# Patient Record
Sex: Male | Born: 1975 | Hispanic: Yes | Marital: Single | Smoking: Never smoker
Health system: Southern US, Community
[De-identification: ages and names within clinical notes are randomized; demographics above are authoritative.]

---

## 2017-06-20 ENCOUNTER — Ambulatory Visit: Payer: Self-pay

## 2018-06-18 ENCOUNTER — Other Ambulatory Visit: Payer: Self-pay

## 2018-06-18 ENCOUNTER — Emergency Department
Admission: EM | Admit: 2018-06-18 | Discharge: 2018-06-18 | Disposition: A | Discharge status: Home / Self Care | Payer: BC Managed Care – PPO | Attending: Emergency Medicine | Admitting: Emergency Medicine

## 2018-06-18 DIAGNOSIS — R1010 Upper abdominal pain, unspecified: Secondary | ICD-10-CM

## 2018-06-18 DIAGNOSIS — K2970 Gastritis, unspecified, without bleeding: Secondary | ICD-10-CM | POA: Diagnosis not present

## 2018-06-18 DIAGNOSIS — R109 Unspecified abdominal pain: Secondary | ICD-10-CM | POA: Diagnosis present

## 2018-06-18 LAB — COMPREHENSIVE METABOLIC PANEL
ALT: 45 U/L — ABNORMAL HIGH (ref 0–44)
AST: 36 U/L (ref 15–41)
Albumin: 4.3 g/dL (ref 3.5–5.0)
Alkaline Phosphatase: 49 U/L (ref 38–126)
Anion gap: 9 (ref 5–15)
BUN: 24 mg/dL — ABNORMAL HIGH (ref 6–20)
CO2: 23 mmol/L (ref 22–32)
Calcium: 8.9 mg/dL (ref 8.9–10.3)
Chloride: 104 mmol/L (ref 98–111)
Creatinine, Ser: 0.87 mg/dL (ref 0.61–1.24)
GFR calc Af Amer: >60 mL/min (ref >60)
GFR calc non Af Amer: >60 mL/min (ref >60)
Glucose, Bld: 148 mg/dL — ABNORMAL HIGH (ref 70–99)
Potassium: 3.8 mmol/L (ref 3.5–5.1)
Sodium: 136 mmol/L (ref 135–145)
Total Bilirubin: 1 mg/dL (ref 0.3–1.2)
Total Protein: 7.6 g/dL (ref 6.5–8.1)

## 2018-06-18 LAB — CBC
HCT: 41.5 % (ref 39.0–52.0)
Hemoglobin: 14.8 g/dL (ref 13.0–17.0)
MCH: 30.6 pg (ref 26.0–34.0)
MCHC: 35.7 g/dL (ref 30.0–36.0)
MCV: 85.7 fL (ref 80.0–100.0)
Platelets: 204 10*3/uL (ref 150–400)
RBC: 4.84 MIL/uL (ref 4.22–5.81)
RDW: 12.9 % (ref 11.5–15.5)
WBC: 15 10*3/uL — ABNORMAL HIGH (ref 4.0–10.5)
nRBC: 0 % (ref 0.0–0.2)

## 2018-06-18 LAB — LIPASE, BLOOD: Lipase: 29 U/L (ref 11–51)

## 2018-06-18 MED ORDER — PANTOPRAZOLE SODIUM 20 MG PO TBEC: 20.0000 mg | DELAYED_RELEASE_TABLET | Freq: Every day | ORAL | 1 refills | Status: DC

## 2018-06-18 MED ORDER — SUCRALFATE 1 G PO TABS: 1.0000 g | ORAL_TABLET | Freq: Four times a day (QID) | ORAL | 0 refills | Status: DC

## 2018-06-18 MED ORDER — LIDOCAINE VISCOUS HCL 2 % MT SOLN
15.0000 mL | Freq: Once | OROMUCOSAL | Status: AC
  Administered 2018-06-18: 15 mL via ORAL
  Filled 2018-06-18: qty 15

## 2018-06-18 MED ORDER — ALUM & MAG HYDROXIDE-SIMETH 200-200-20 MG/5ML PO SUSP
30.0000 mL | Freq: Once | ORAL | Status: AC
  Administered 2018-06-18: 30 mL via ORAL
  Filled 2018-06-18: qty 30

## 2018-06-18 MED ORDER — LIDOCAINE VISCOUS HCL 2 % MT SOLN
15.0000 mL | Freq: Once | OROMUCOSAL | Status: AC
  Administered 2018-06-18: 17:00:00 15 mL via ORAL
  Filled 2018-06-18: qty 15

## 2018-06-18 MED ORDER — ONDANSETRON HCL 4 MG/2ML IJ SOLN
INTRAMUSCULAR | Status: AC
  Filled 2018-06-18: qty 2

## 2018-06-18 MED ORDER — ONDANSETRON HCL 4 MG/2ML IJ SOLN
4.0000 mg | Freq: Once | INTRAMUSCULAR | Status: AC
  Administered 2018-06-18: 4 mg via INTRAVENOUS

## 2018-06-18 NOTE — ED Notes (Signed)
Pt requesting to speak to MD at this time, states pain still 8/10. Would like to speak to MD prior to d/c

## 2018-06-18 NOTE — ED Triage Notes (Signed)
Generalized abdominal pain that started today, throbbing 10/10, NVD. Denies CP or SOB. Denies urinary sx.

## 2018-06-18 NOTE — Discharge Instructions (Signed)
Please seek medical attention for any high fevers, chest pain, shortness of breath, change in behavior, persistent vomiting, bloody stool or any other new or concerning symptoms.  

## 2018-06-18 NOTE — ED Notes (Addendum)
Patient states "my stomach feels very full and I cannot get comfortable. I feel lots of contractions in my stomach that wont stop. I cannot sit down or stand without pain"

## 2018-06-18 NOTE — ED Notes (Signed)
Patient given oral meds (See MAR). Patient vomitd up meds about 1 minute after taking. MD made aware and ordered IV zofran

## 2018-06-18 NOTE — ED Provider Notes (Addendum)
[facility] Emergency Department Provider Note   ____________________________________________   I have reviewed the triage vital signs and the nursing notes.   HISTORY  Chief Complaint Abdominal Pain   History limited by: Not Limited   HPI Matthew Li is a 43 y.o. male who presents to the emergency department today because of concern for abdominal pain. Started this morning and it has been constant. It is severe. Worse with lying flat. Has had some nausea. Did make himself throw up but that did not help his discomfort. Has had some diarrhea. Denies any fevers. Denies similar pain in the past.    History reviewed. No pertinent past medical history.  There are no active problems to display for this patient.   History reviewed. No pertinent surgical history.  Prior to Admission medications   Not on File    Allergies Patient has no known allergies.  No family history on file.  Social History Social History   Tobacco Use  . Smoking status: Never Smoker  Substance Use Topics  . Alcohol use: Yes  . Drug use: Not on file    Review of Systems Constitutional: No fever/chills Eyes: No visual changes. ENT: No sore throat. Cardiovascular: Denies chest pain. Respiratory: Denies shortness of breath. Gastrointestinal: Positive for abdominal pain.  Genitourinary: Negative for dysuria. Musculoskeletal: Negative for back pain. Skin: Negative for rash. Neurological: Negative for headaches, focal weakness or numbness.  ____________________________________________   PHYSICAL EXAM:  VITAL SIGNS: ED Triage Vitals  Enc Vitals Group     BP 06/18/18 1514 120/62     Pulse Rate 06/18/18 1514 65     Resp 06/18/18 1514 18     Temp 06/18/18 1514 98.4 F (36.9 C)     Temp Source 06/18/18 1514 Oral     SpO2 06/18/18 1514 99 %     Weight 06/18/18 1514 165 lb 4 oz (75 kg)     Height 06/18/18 1514 5\' 3"  (1.6 m)     Head Circumference --      Peak  Flow --      Pain Score 06/18/18 1526 10   Constitutional: Alert and oriented.  Eyes: Conjunctivae are normal.  ENT      Head: Normocephalic and atraumatic.      Nose: No congestion/rhinnorhea.      Mouth/Throat: Mucous membranes are moist.      Neck: No stridor. Hematological/Lymphatic/Immunilogical: No cervical lymphadenopathy. Cardiovascular: Normal rate, regular rhythm.  No murmurs, rubs, or gallops.  Respiratory: Normal respiratory effort without tachypnea nor retractions. Breath sounds are clear and equal bilaterally. No wheezes/rales/rhonchi. Gastrointestinal: Soft and tender to palpation in the upper abdomen. Genitourinary: Deferred Musculoskeletal: Normal range of motion in all extremities. No lower extremity edema. Neurologic:  Normal speech and language. No gross focal neurologic deficits are appreciated.  Skin:  Skin is warm, dry and intact. No rash noted. Psychiatric: Mood and affect are normal. Speech and behavior are normal. Patient exhibits appropriate insight and judgment.  ____________________________________________    LABS (pertinent positives/negatives)  Lipase 29 CBC wbc 15.0, hgb 14.8, plt 204 CMP wnl except glu 148, bun 24, alt 45  ____________________________________________   EKG  I, Phineas SemenGraydon [place] Dube, attending physician, personally viewed and interpreted this EKG  EKG Time: 1518 Rate: 59 Rhythm: sinus bradycardia Axis: normal Intervals: qtc 429 QRS: narrow ST changes: no st elevation Impression: bradycardia otherwise normal ekg   ____________________________________________    RADIOLOGY  None  ____________________________________________   PROCEDURES  Procedures  ____________________________________________  INITIAL IMPRESSION / ASSESSMENT AND PLAN / ED COURSE  Pertinent labs & imaging results that were available during my care of the patient were reviewed by me and considered in my medical decision making (see chart for  details).   Patient presented to the emergency department today because of concern for abdominal pain. On exam patient has some tenderness in the upper abdomen. No specific tenderness over the RUQ. Patient's blood work showed a mild leukocytosis. The patient did feel improvement after GI cocktail. At this point I think gastritis likely. Doubt gallbladder disease given no significant RUQ tenderness. Also doubt appendicitis given lack of lower abdominal tenderness or fever. Discussed possible gastritis with patient. Will give patient medication and information of dietary changes.  ____________________________________________   FINAL CLINICAL IMPRESSION(S) / ED DIAGNOSES  Final diagnoses:  Pain of upper abdomen  Gastritis without bleeding, unspecified chronicity, unspecified gastritis type     Note: This dictation was prepared with Dragon dictation. Any transcriptional errors that result from this process are unintentional     Phineas Semen, MD 06/18/18 1759    Phineas Semen, MD 06/25/18 [phone]

## 2018-06-19 ENCOUNTER — Emergency Department: Payer: BC Managed Care – PPO

## 2018-06-19 ENCOUNTER — Ambulatory Visit (INDEPENDENT_AMBULATORY_CARE_PROVIDER_SITE_OTHER): Payer: BC Managed Care – PPO | Admitting: Gastroenterology

## 2018-06-19 ENCOUNTER — Telehealth: Payer: Self-pay | Admitting: Gastroenterology

## 2018-06-19 ENCOUNTER — Other Ambulatory Visit: Payer: Self-pay

## 2018-06-19 ENCOUNTER — Inpatient Hospital Stay
Admission: EM | Admit: 2018-06-19 | Discharge: 2018-06-29 | DRG: 339 | Disposition: A | Discharge status: Home / Self Care | Payer: BC Managed Care – PPO | Attending: Surgery | Admitting: Surgery

## 2018-06-19 ENCOUNTER — Encounter: Payer: Self-pay | Admitting: Radiology

## 2018-06-19 DIAGNOSIS — K3580 Unspecified acute appendicitis: Secondary | ICD-10-CM

## 2018-06-19 DIAGNOSIS — R109 Unspecified abdominal pain: Secondary | ICD-10-CM | POA: Diagnosis not present

## 2018-06-19 DIAGNOSIS — R1084 Generalized abdominal pain: Secondary | ICD-10-CM | POA: Diagnosis not present

## 2018-06-19 DIAGNOSIS — Z5331 Laparoscopic surgical procedure converted to open procedure: Secondary | ICD-10-CM

## 2018-06-19 DIAGNOSIS — K651 Peritoneal abscess: Secondary | ICD-10-CM

## 2018-06-19 DIAGNOSIS — K3532 Acute appendicitis with perforation and localized peritonitis, without abscess: Principal | ICD-10-CM | POA: Diagnosis present

## 2018-06-19 DIAGNOSIS — Z79899 Other long term (current) drug therapy: Secondary | ICD-10-CM

## 2018-06-19 DIAGNOSIS — R339 Retention of urine, unspecified: Secondary | ICD-10-CM | POA: Diagnosis present

## 2018-06-19 DIAGNOSIS — K9189 Other postprocedural complications and disorders of digestive system: Secondary | ICD-10-CM

## 2018-06-19 DIAGNOSIS — K660 Peritoneal adhesions (postprocedural) (postinfection): Secondary | ICD-10-CM | POA: Diagnosis present

## 2018-06-19 DIAGNOSIS — Z4659 Encounter for fitting and adjustment of other gastrointestinal appliance and device: Secondary | ICD-10-CM

## 2018-06-19 DIAGNOSIS — K567 Ileus, unspecified: Secondary | ICD-10-CM | POA: Diagnosis present

## 2018-06-19 LAB — COMPREHENSIVE METABOLIC PANEL
ALT: 33 U/L (ref 0–44)
AST: 25 U/L (ref 15–41)
Albumin: 4.4 g/dL (ref 3.5–5.0)
Alkaline Phosphatase: 56 U/L (ref 38–126)
Anion gap: 13 (ref 5–15)
BUN: 16 mg/dL (ref 6–20)
CO2: 24 mmol/L (ref 22–32)
Calcium: 9.2 mg/dL (ref 8.9–10.3)
Chloride: 97 mmol/L — ABNORMAL LOW (ref 98–111)
Creatinine, Ser: 1.14 mg/dL (ref 0.61–1.24)
GFR calc Af Amer: >60 mL/min (ref >60)
GFR calc non Af Amer: >60 mL/min (ref >60)
Glucose, Bld: 165 mg/dL — ABNORMAL HIGH (ref 70–99)
Potassium: 4 mmol/L (ref 3.5–5.1)
Sodium: 134 mmol/L — ABNORMAL LOW (ref 135–145)
Total Bilirubin: 3.4 mg/dL — ABNORMAL HIGH (ref 0.3–1.2)
Total Protein: 8.6 g/dL — ABNORMAL HIGH (ref 6.5–8.1)

## 2018-06-19 LAB — URINALYSIS, COMPLETE (UACMP) WITH MICROSCOPIC
Bacteria, UA: NONE SEEN
Bilirubin Urine: NEGATIVE
Glucose, UA: NEGATIVE mg/dL
Ketones, ur: NEGATIVE mg/dL
Leukocytes,Ua: NEGATIVE
Nitrite: NEGATIVE
Protein, ur: 100 mg/dL — AB
Specific Gravity, Urine: 1.027 (ref 1.005–1.030)
pH: 5 (ref 5.0–8.0)

## 2018-06-19 LAB — CBC WITH DIFFERENTIAL/PLATELET
Abs Immature Granulocytes: 0.06 10*3/uL (ref 0.00–0.07)
Basophils Absolute: 0 10*3/uL (ref 0.0–0.1)
Basophils Relative: 0 %
Eosinophils Absolute: 0.2 10*3/uL (ref 0.0–0.5)
Eosinophils Relative: 1 %
HCT: 46.7 % (ref 39.0–52.0)
Hemoglobin: 16.5 g/dL (ref 13.0–17.0)
Immature Granulocytes: 0 %
Lymphocytes Relative: 6 %
Lymphs Abs: 0.9 10*3/uL (ref 0.7–4.0)
MCH: 30.2 pg (ref 26.0–34.0)
MCHC: 35.3 g/dL (ref 30.0–36.0)
MCV: 85.4 fL (ref 80.0–100.0)
Monocytes Absolute: 0.7 10*3/uL (ref 0.1–1.0)
Monocytes Relative: 5 %
Neutro Abs: 12.2 10*3/uL — ABNORMAL HIGH (ref 1.7–7.7)
Neutrophils Relative %: 88 %
Platelets: 222 10*3/uL (ref 150–400)
RBC: 5.47 MIL/uL (ref 4.22–5.81)
RDW: 13 % (ref 11.5–15.5)
WBC: 14.1 10*3/uL — ABNORMAL HIGH (ref 4.0–10.5)
nRBC: 0 % (ref 0.0–0.2)

## 2018-06-19 LAB — LIPASE, BLOOD: Lipase: 24 U/L (ref 11–51)

## 2018-06-19 MED ORDER — POLYETHYLENE GLYCOL 3350 17 G PO PACK: 17.0000 g | PACK | Freq: Every day | ORAL | 0 refills | Status: DC

## 2018-06-19 MED ORDER — PANTOPRAZOLE SODIUM 40 MG PO TBEC: 40.0000 mg | DELAYED_RELEASE_TABLET | Freq: Every day | ORAL | 0 refills | Status: DC

## 2018-06-19 MED ORDER — MORPHINE SULFATE (PF) 4 MG/ML IV SOLN
4.0000 mg | Freq: Once | INTRAVENOUS | Status: AC
  Administered 2018-06-19: 22:00:00 4 mg via INTRAVENOUS
  Filled 2018-06-19: qty 1

## 2018-06-19 MED ORDER — IOHEXOL 300 MG/ML  SOLN
100.0000 mL | Freq: Once | INTRAMUSCULAR | Status: AC | PRN
  Administered 2018-06-19: 100 mL via INTRAVENOUS

## 2018-06-19 MED ORDER — PIPERACILLIN-TAZOBACTAM 3.375 G IVPB 30 MIN
3.3750 g | Freq: Once | INTRAVENOUS | Status: AC
  Administered 2018-06-19: 23:00:00 3.375 g via INTRAVENOUS
  Filled 2018-06-19: qty 50

## 2018-06-19 MED ORDER — SODIUM CHLORIDE 0.9 % IV BOLUS
1000.0000 mL | Freq: Once | INTRAVENOUS | Status: AC
  Administered 2018-06-19: 22:00:00 1000 mL via INTRAVENOUS

## 2018-06-19 MED ORDER — ONDANSETRON HCL 4 MG/2ML IJ SOLN
4.0000 mg | Freq: Once | INTRAMUSCULAR | Status: AC
  Administered 2018-06-19: 22:00:00 4 mg via INTRAVENOUS
  Filled 2018-06-19: qty 2

## 2018-06-19 MED ORDER — HYDROMORPHONE HCL 1 MG/ML IJ SOLN
1.0000 mg | Freq: Once | INTRAMUSCULAR | Status: AC
  Administered 2018-06-19: 1 mg via INTRAVENOUS
  Filled 2018-06-19: qty 1

## 2018-06-19 MED ORDER — ACETAMINOPHEN 500 MG PO TABS
1000.0000 mg | ORAL_TABLET | Freq: Once | ORAL | Status: AC
  Administered 2018-06-19: 22:00:00 1000 mg via ORAL
  Filled 2018-06-19: qty 2

## 2018-06-19 NOTE — ED Provider Notes (Signed)
[facility] Emergency Department Provider Note  ____________________________________________  Time seen: Approximately 10:58 PM  I have reviewed the triage vital signs and the nursing notes.   HISTORY  Chief Complaint Abdominal Pain   HPI Matthew Li is a 43 y.o. male with no significant past medical history who presents for evaluation of abdominal pain.  Patient is complaining of initially diffuse now lower abdominal pain that is severe, sharp, constant and nonradiating.  Pain associated with nausea, one episode of vomiting, one episode of diarrhea yesterday, and now fever.  Patient was seen here yesterday and discharged home with possible gastroenteritis.  Patient reports that the pain got worse.  No prior abdominal surgeries.  Pain is currently 10 out of 10.  PMH None - reviewed  Prior to Admission medications   Medication Sig Start Date End Date Taking? Authorizing Provider  pantoprazole (PROTONIX) 40 MG tablet Take 1 tablet (40 mg total) by mouth daily. 06/19/18   Pasty Spillersahiliani, Varnita B, MD  polyethylene glycol (MIRALAX / GLYCOLAX) 17 g packet Take 17 g by mouth daily. 06/19/18   Pasty Spillersahiliani, Varnita B, MD  sucralfate (CARAFATE) 1 g tablet Take 1 tablet (1 g total) by mouth 4 (four) times daily. 06/18/18   Phineas SemenGoodman, Graydon, MD    Allergies Patient has no known allergies.  No family history on file.  Social History Social History   Tobacco Use  . Smoking status: Never Smoker  Substance Use Topics  . Alcohol use: Yes  . Drug use: Not on file    Review of Systems  Constitutional: Negative for fever. Eyes: Negative for visual changes. ENT: Negative for sore throat. Neck: No neck pain  Cardiovascular: Negative for chest pain. Respiratory: Negative for shortness of breath. Gastrointestinal: + abdominal pain, nausea, vomiting and diarrhea. Genitourinary: Negative for dysuria. Musculoskeletal: Negative for back pain. Skin: Negative for rash.  Neurological: Negative for headaches, weakness or numbness. Psych: No SI or HI  ____________________________________________   PHYSICAL EXAM:  VITAL SIGNS: ED Triage Vitals  Enc Vitals Group     BP 06/19/18 2152 124/83     Pulse Rate 06/19/18 2152 (!) 133     Resp 06/19/18 2152 20     Temp 06/19/18 2152 100.2 F (37.9 C)     Temp Source 06/19/18 2152 Oral     SpO2 06/19/18 2152 100 %     Weight 06/19/18 2151 165 lb 5.5 oz (75 kg)     Height --      Head Circumference --      Peak Flow --      Pain Score 06/19/18 2151 10     Pain Loc --      Pain Edu? --      Excl. in GC? --     Constitutional: Alert and oriented. Well appearing and in no apparent distress. HEENT:      Head: Normocephalic and atraumatic.         Eyes: Conjunctivae are normal. Sclera is non-icteric.       Mouth/Throat: Mucous membranes are moist.       Neck: Supple with no signs of meningismus. Cardiovascular: Tachycardic with regular rhythm. No murmurs, gallops, or rubs. 2+ symmetrical distal pulses are present in all extremities. No JVD. Respiratory: Normal respiratory effort. Lungs are clear to auscultation bilaterally. No wheezes, crackles, or rhonchi.  Gastrointestinal: Soft, diffuse tenderness to palpation with significant tenderness in the right lower quadrant and localized guarding, and non distended with positive bowel sounds.  Musculoskeletal: Nontender with normal range of motion in all extremities. No edema, cyanosis, or erythema of extremities. Neurologic: Normal speech and language. Face is symmetric. Moving all extremities. No gross focal neurologic deficits are appreciated. Skin: Skin is warm, dry and intact. No rash noted. Psychiatric: Mood and affect are normal. Speech and behavior are normal.  ____________________________________________   LABS (all labs ordered are listed, but only abnormal results are displayed)  Labs Reviewed  COMPREHENSIVE METABOLIC PANEL - Abnormal; Notable for  the following components:      Result Value   Sodium 134 (*)    Chloride 97 (*)    Glucose, Bld 165 (*)    Total Protein 8.6 (*)    Total Bilirubin 3.4 (*)    All other components within normal limits  LIPASE, BLOOD  CBC WITH DIFFERENTIAL/PLATELET  URINALYSIS, COMPLETE (UACMP) WITH MICROSCOPIC   ____________________________________________  EKG  none  ____________________________________________  RADIOLOGY  I have personally reviewed the images performed during this visit and I agree with the Radiologist's read.   Interpretation by Radiologist:  Ct Abdomen Pelvis W Contrast  Result Date: 06/19/2018 CLINICAL DATA:  43 year old male with right abdominal pain since yesterday. EXAM: CT ABDOMEN AND PELVIS WITH CONTRAST TECHNIQUE: Multidetector CT imaging of the abdomen and pelvis was performed using the standard protocol following bolus administration of intravenous contrast. CONTRAST:  OMNIPAQUE IOHEXOL 300 MG/ML  SOLN COMPARISON:  None. FINDINGS: Lower chest: Mild respiratory motion, otherwise negative. Hepatobiliary: Hepatic steatosis. Negative gallbladder. No bile duct enlargement. Pancreas: Negative. Spleen: Negative. Adrenals/Urinary Tract: Normal adrenal glands. Normal bilateral renal enhancement and contrast excretion. Retroperitoneal stranding along the course of the ureters at the pelvic inlet, more so the right. Diminutive and unremarkable urinary bladder. Stomach/Bowel: Decompressed rectosigmoid colon with regional fluid and inflammatory stranding secondary to the appendix. Similar decompressed distal transverse and descending colon. Gas and liquid stool in the proximal transverse and the right colon. Dilated and inflamed appendix courses into the midline and across into the left abdomen from the cecum. There is a 10 millimeter round appendicoliths at the origin (series 2 images 56 and 58 and coronal images 49 through 46). Confluent regional inflammatory stranding. No  extraluminal gas. Regional free fluid including tracking into the distal small bowel mesentery on series 2, image 52. Secondary inflammation of the terminal ileum and other regional small bowel loops. Regional reactive appearing lymphadenopathy in the mesentery. Appendix: Location: Tracks across midline to the left posterior to small bowel in the upper abdomen. Diameter: 15 millimeters Appendicolith: Positive, 10 millimeters Mucosal hyper-enhancement: Positive Extraluminal gas: Negative Periappendiceal collection: Phlegmon and free fluid, no organized collection. There are mildly dilated fluid-filled small bowel loops in the left abdomen. These have a gradual transition. Largely decompressed and negative stomach. No free air identified. Vascular/Lymphatic: Major arterial structures are patent and normal. Portal venous system is patent. No other abnormal lymph nodes Reproductive: Negative. Other: Small to moderate volume of pelvic free fluid with simple fluid density (series 2, image 74). Musculoskeletal: Negative. IMPRESSION: 1. Positive for Acute Appendicitis. Severely inflamed appendix with 10 mm appendicolith. Confluent regional inflammation. Moderate secondary inflammation of distal small bowel and upstream small bowel ileus. Free fluid but no perforation suspected. No organized fluid collection or abscess. 2. Hepatic steatosis. Electronically Signed   By: Odessa Fleming M.D.   On: 06/19/2018 22:49     ____________________________________________   PROCEDURES  Procedure(s) performed: None Procedures Critical Care performed: yes  CRITICAL CARE Performed by: Nita Sickle  ?  Total  critical care time: 35 min  Critical care time was exclusive of separately billable procedures and treating other patients.  Critical care was necessary to treat or prevent imminent or life-threatening deterioration.  Critical care was time spent personally by me on the following activities: development of treatment  plan with patient and/or surrogate as well as nursing, discussions with consultants, evaluation of patient's response to treatment, examination of patient, obtaining history from patient or surrogate, ordering and performing treatments and interventions, ordering and review of laboratory studies, ordering and review of radiographic studies, pulse oximetry and re-evaluation of patient's condition.  ____________________________________________   INITIAL IMPRESSION / ASSESSMENT AND PLAN / ED COURSE   43 y.o. male with no significant past medical history who presents for evaluation of abdominal pain.  Patient with significant tenderness to palpation diffusely and localized guarding on the RLQ.  CT consistent with acute appendicitis with no perforation or abscess.  Patient with a fever of 100.64F and tachycardic to 133.  Given Tylenol, Zosyn, IV fluids, IV Dilaudid, Zofran.  Discussed with Dr. Geoffery Lyons nurse since he is currently in surgery in the operating room for admission for surgical management. Care transferred to Dr. Dolores Frame.       As part of my medical decision making, I reviewed the following data within the electronic MEDICAL RECORD NUMBER Nursing notes reviewed and incorporated, Labs reviewed , Old chart reviewed, Radiograph reviewed , A consult was requested and obtained from this/these consultant(s) Surgery, Notes from prior ED visits and [place] Controlled Substance Database    Pertinent labs & imaging results that were available during my care of the patient were reviewed by me and considered in my medical decision making (see chart for details).    ____________________________________________   FINAL CLINICAL IMPRESSION(S) / ED DIAGNOSES  Final diagnoses:  Uncomplicated acute appendicitis      NEW MEDICATIONS STARTED DURING THIS VISIT:  ED Discharge Orders    None       Note:  This document was prepared using Dragon voice recognition software and may include unintentional dictation  errors.    Nita Sickle, MD 06/19/18 [phone]

## 2018-06-19 NOTE — ED Triage Notes (Signed)
Pt in with co upper abd pain that started yesterday, seen here for the same yest. Was dx with gastritis, here for persistent symptoms.

## 2018-06-19 NOTE — Telephone Encounter (Signed)
I spoke with pt and he would like his CT scan ordered stat, that he is in a lot of pain and does not want to go back to the ED because they want do anything different per pt.  Dr. Maximino Greenland advised pt to go to ED or if he can wait we can order the CT stat, which has been done. I tried to order through scheduling but we do not know how long it has been since pt ate. I had to leave pt a message to contact office. (he may have already gone to ED, not sure). I will contact scheduling, advising them that I was inable to reach pt via phone and that he may possibly show up in the ED.

## 2018-06-19 NOTE — ED Notes (Signed)
Pt pacing in room and knelling down onto floor in room. Unable to get comfortable, diaphoretic

## 2018-06-19 NOTE — Telephone Encounter (Signed)
I called the patient at his listed number and it went to an identified voicemail with his name. I left a message that I am calling to check in on him and see how his symptoms are doing. I have encouraged him to go the ER if he has not already done so, if his symptoms have worsened. We also ordered a stat CT this morning and I have asked him to get this done if he has not gone to the ER yet.   The office is closed and so I have left the [facility] number to call [phone] with any questions and ask to be connected to the gastroenterologist on call.

## 2018-06-19 NOTE — Telephone Encounter (Signed)
Patient has just called in stating he is getting worse. Please call & talk with him. He is thinking about going to the ED.

## 2018-06-19 NOTE — Progress Notes (Signed)
Matthew Li [address]  [address]  [place], [place] [address]  Main: [phone]  Fax: [phone]   Gastroenterology Consultation  Referring Provider:     Preston Fleeting* Primary Care Physician:  Preston Fleeting, MD Reason for Consultation:     Abdominal pain        HPI:   Virtual Visit via Video Note  I connected with patient on 06/19/18 at 10:30 AM EDT by video ([email or web address]) and verified that I am speaking with the correct person using two identifiers.   I discussed the limitations, risks, security and privacy concerns of performing an evaluation and management service by video and the availability of in person appointments. I also discussed with the patient that there may be a patient responsible charge related to this service. The patient expressed understanding and agreed to proceed.  Location of the patient: Home Location of provider: Home Participating persons: Patient and provider only (Nursing staff checked in patient via phone but were not physically involved in the video interaction - see their notes)   History of Present Illness: Chief Complaint  Patient presents with  . Follow-up    ED, abdominal pain    [name] is a 43 y.o. y/o male referred for consultation & management  by Dr. Neita Goodnight, Presley Raddle, MD.  Patient reports 1 day history of diffuse abdominal pain that started yesterday morning.  Patient went to the ER and was treated as gastritis.  He was given Protonix and Carafate to take home.  Patient has been taking these medications and states it is not helping.  No previous episodes of similar symptoms.  No imaging was done in the ER.  Denies any blood in stool.  Reports some nausea vomiting.  Reports having a loose bowel movement yesterday morning, and no bowel movement since then.  No melena.  No hematemesis.  No weight loss.  No new medications.  No recent travel.  No fever or chills.  No cough.  Past medical  history: None  Prior to Admission medications   Medication Sig Start Date End Date Taking? Authorizing Provider  pantoprazole (PROTONIX) 20 MG tablet Take 1 tablet (20 mg total) by mouth daily. 06/18/18 06/18/19  Phineas Semen, MD  sucralfate (CARAFATE) 1 g tablet Take 1 tablet (1 g total) by mouth 4 (four) times daily. 06/18/18   Phineas Semen, MD    No family history on file.   Social History   Tobacco Use  . Smoking status: Never Smoker  Substance Use Topics  . Alcohol use: Yes  . Drug use: Not on file    Allergies as of 06/19/2018  . (No Known Allergies)    Review of Systems:    All systems reviewed and negative except where noted in HPI.   Observations/Objective:  Labs: CBC    Component Value Date/Time   WBC 15.0 (H) 06/18/2018 1528   RBC 4.84 06/18/2018 1528   HGB 14.8 06/18/2018 1528   HCT 41.5 06/18/2018 1528   PLT 204 06/18/2018 1528   MCV 85.7 06/18/2018 1528   MCH 30.6 06/18/2018 1528   MCHC 35.7 06/18/2018 1528   RDW 12.9 06/18/2018 1528   CMP     Component Value Date/Time   NA 136 06/18/2018 1528   K 3.8 06/18/2018 1528   CL 104 06/18/2018 1528   CO2 23 06/18/2018 1528   GLUCOSE 148 (H) 06/18/2018 1528   BUN 24 (H) 06/18/2018 1528   CREATININE 0.87 06/18/2018 1528  CALCIUM 8.9 06/18/2018 1528   PROT 7.6 06/18/2018 1528   ALBUMIN 4.3 06/18/2018 1528   AST 36 06/18/2018 1528   ALT 45 (H) 06/18/2018 1528   ALKPHOS 49 06/18/2018 1528   BILITOT 1.0 06/18/2018 1528   GFRNONAA >60 06/18/2018 1528   GFRAA >60 06/18/2018 1528    Imaging Studies: No results found.  Assessment and Plan:   Jobe IgoJuan Carlos Monterroso is a 43 y.o. y/o male has been referred for abdominal pain  Assessment and Plan: Patient seen in the ER yesterday with benign labs and sent home with diagnosis of gastritis, on PPI and Carafate, with ongoing pain.  We will obtain abdominal imaging for further evaluation.   Gastroenteritis is certainly on the differential given that  patient had loose stools yesterday x1 and also had nausea vomiting yesterday.  GERD or dyspepsia would be other causes  We will increase Protonix to 40 mg daily  Patient also asked to start taking MiraLAX daily as he has not had a bowel movement since yesterday morning to see if it helps with his pain as well  We will also obtain H. pylori breath test  Follow Up Instructions: Clinic follow-up in 2 to 3 weeks to reassess symptoms However, patient will be contacted once above work-up is available for further management  I discussed the assessment and treatment plan with the patient. The patient was provided an opportunity to ask questions and all were answered. The patient agreed with the plan and demonstrated an understanding of the instructions.   The patient was advised to call back or seek an in-person evaluation if the symptoms worsen or if the condition fails to improve as anticipated.  I provided 30 minutes of face-to-face time via video software during this encounter.   Pasty SpillersVarnita B Nicolas Sisler, MD  Speech recognition software was used to dictate the above note.

## 2018-06-19 NOTE — Telephone Encounter (Signed)
Left vm for pt to schedule ED f/u

## 2018-06-20 ENCOUNTER — Encounter: Payer: Self-pay | Admitting: *Deleted

## 2018-06-20 ENCOUNTER — Encounter
Admission: EM | Disposition: A | Discharge status: Home / Self Care | Payer: Self-pay | Source: Home / Self Care | Attending: Surgery

## 2018-06-20 ENCOUNTER — Emergency Department: Payer: BC Managed Care – PPO | Admitting: Anesthesiology

## 2018-06-20 ENCOUNTER — Other Ambulatory Visit: Payer: Self-pay

## 2018-06-20 DIAGNOSIS — Z5331 Laparoscopic surgical procedure converted to open procedure: Secondary | ICD-10-CM | POA: Diagnosis not present

## 2018-06-20 DIAGNOSIS — K3532 Acute appendicitis with perforation and localized peritonitis, without abscess: Secondary | ICD-10-CM | POA: Diagnosis present

## 2018-06-20 DIAGNOSIS — K567 Ileus, unspecified: Secondary | ICD-10-CM | POA: Diagnosis present

## 2018-06-20 DIAGNOSIS — R109 Unspecified abdominal pain: Secondary | ICD-10-CM | POA: Diagnosis present

## 2018-06-20 DIAGNOSIS — R339 Retention of urine, unspecified: Secondary | ICD-10-CM | POA: Diagnosis present

## 2018-06-20 DIAGNOSIS — Z79899 Other long term (current) drug therapy: Secondary | ICD-10-CM | POA: Diagnosis not present

## 2018-06-20 DIAGNOSIS — K660 Peritoneal adhesions (postprocedural) (postinfection): Secondary | ICD-10-CM | POA: Diagnosis present

## 2018-06-20 HISTORY — PX: APPENDECTOMY: SHX54

## 2018-06-20 HISTORY — PX: LAPAROSCOPIC APPENDECTOMY: SHX408

## 2018-06-20 SURGERY — APPENDECTOMY, LAPAROSCOPIC
Anesthesia: General | Site: Abdomen

## 2018-06-20 MED ORDER — EVICEL 2 ML EX KIT
PACK | CUTANEOUS | Status: AC
  Filled 2018-06-20: qty 1

## 2018-06-20 MED ORDER — PIPERACILLIN-TAZOBACTAM 3.375 G IVPB
3.3750 g | Freq: Three times a day (TID) | INTRAVENOUS | Status: DC
  Administered 2018-06-20 – 2018-06-29 (×27): 3.375 g via INTRAVENOUS
  Filled 2018-06-20 (×25): qty 50

## 2018-06-20 MED ORDER — PANTOPRAZOLE SODIUM 40 MG IV SOLR
40.0000 mg | Freq: Every day | INTRAVENOUS | Status: DC
  Administered 2018-06-20 – 2018-06-28 (×9): 40 mg via INTRAVENOUS
  Filled 2018-06-20 (×10): qty 40

## 2018-06-20 MED ORDER — LIDOCAINE HCL (CARDIAC) PF 100 MG/5ML IV SOSY
PREFILLED_SYRINGE | INTRAVENOUS | Status: DC | PRN
  Administered 2018-06-20: 50 mg via INTRAVENOUS

## 2018-06-20 MED ORDER — BUPIVACAINE HCL (PF) 0.5 % IJ SOLN
INTRAMUSCULAR | Status: AC
  Filled 2018-06-20: qty 30

## 2018-06-20 MED ORDER — LACTATED RINGERS IV SOLN
INTRAVENOUS | Status: DC | PRN
  Administered 2018-06-20 (×2): via INTRAVENOUS

## 2018-06-20 MED ORDER — SODIUM CHLORIDE (PF) 0.9 % IJ SOLN
INTRAMUSCULAR | Status: AC
  Filled 2018-06-20: qty 50

## 2018-06-20 MED ORDER — SODIUM CHLORIDE 0.9 % IV SOLN
INTRAVENOUS | Status: DC | PRN
  Administered 2018-06-20: 70 mL

## 2018-06-20 MED ORDER — ACETAMINOPHEN 10 MG/ML IV SOLN
INTRAVENOUS | Status: AC
  Filled 2018-06-20: qty 100

## 2018-06-20 MED ORDER — ACETAMINOPHEN 325 MG PO TABS
650.0000 mg | ORAL_TABLET | Freq: Four times a day (QID) | ORAL | Status: DC | PRN
  Administered 2018-06-22: 650 mg via ORAL
  Filled 2018-06-20: qty 2

## 2018-06-20 MED ORDER — BUPIVACAINE LIPOSOME 1.3 % IJ SUSP
INTRAMUSCULAR | Status: AC
  Filled 2018-06-20: qty 20

## 2018-06-20 MED ORDER — ONDANSETRON HCL 4 MG/2ML IJ SOLN
4.0000 mg | Freq: Four times a day (QID) | INTRAMUSCULAR | Status: DC | PRN
  Administered 2018-06-21 (×2): 4 mg via INTRAVENOUS
  Filled 2018-06-20 (×2): qty 2

## 2018-06-20 MED ORDER — DEXAMETHASONE SODIUM PHOSPHATE 10 MG/ML IJ SOLN
INTRAMUSCULAR | Status: AC
  Filled 2018-06-20: qty 1

## 2018-06-20 MED ORDER — SUCCINYLCHOLINE CHLORIDE 20 MG/ML IJ SOLN
INTRAMUSCULAR | Status: DC | PRN
  Administered 2018-06-20: 100 mg via INTRAVENOUS

## 2018-06-20 MED ORDER — ONDANSETRON HCL 4 MG/2ML IJ SOLN
INTRAMUSCULAR | Status: DC | PRN
  Administered 2018-06-20: 4 mg via INTRAVENOUS

## 2018-06-20 MED ORDER — GABAPENTIN 300 MG PO CAPS
300.0000 mg | ORAL_CAPSULE | Freq: Two times a day (BID) | ORAL | Status: DC
  Administered 2018-06-20 – 2018-06-29 (×16): 300 mg via ORAL
  Filled 2018-06-20 (×18): qty 1

## 2018-06-20 MED ORDER — FENTANYL CITRATE (PF) 100 MCG/2ML IJ SOLN
INTRAMUSCULAR | Status: AC
  Filled 2018-06-20: qty 2

## 2018-06-20 MED ORDER — PROPOFOL 10 MG/ML IV BOLUS
INTRAVENOUS | Status: AC
  Filled 2018-06-20: qty 20

## 2018-06-20 MED ORDER — MORPHINE SULFATE (PF) 2 MG/ML IV SOLN
1.0000 mg | INTRAVENOUS | Status: DC | PRN
  Administered 2018-06-20 – 2018-06-21 (×4): 1 mg via INTRAVENOUS
  Filled 2018-06-20 (×4): qty 1

## 2018-06-20 MED ORDER — PIPERACILLIN-TAZOBACTAM 3.375 G IVPB
INTRAVENOUS | Status: AC
  Administered 2018-06-20: 07:00:00 3.375 g via INTRAVENOUS
  Filled 2018-06-20: qty 50

## 2018-06-20 MED ORDER — PROPOFOL 10 MG/ML IV BOLUS
INTRAVENOUS | Status: DC | PRN
  Administered 2018-06-20: 150 mg via INTRAVENOUS

## 2018-06-20 MED ORDER — LIDOCAINE HCL (PF) 2 % IJ SOLN
INTRAMUSCULAR | Status: AC
  Filled 2018-06-20: qty 10

## 2018-06-20 MED ORDER — LACTATED RINGERS IV SOLN
INTRAVENOUS | Status: DC
  Administered 2018-06-20 (×2): via INTRAVENOUS

## 2018-06-20 MED ORDER — SUGAMMADEX SODIUM 200 MG/2ML IV SOLN
INTRAVENOUS | Status: AC
  Filled 2018-06-20: qty 2

## 2018-06-20 MED ORDER — FENTANYL CITRATE (PF) 100 MCG/2ML IJ SOLN
INTRAMUSCULAR | Status: DC | PRN
  Administered 2018-06-20 (×4): 50 ug via INTRAVENOUS

## 2018-06-20 MED ORDER — TRAMADOL HCL 50 MG PO TABS
50.0000 mg | ORAL_TABLET | Freq: Four times a day (QID) | ORAL | Status: DC | PRN
  Administered 2018-06-20: 50 mg via ORAL
  Filled 2018-06-20: qty 1

## 2018-06-20 MED ORDER — ACETAMINOPHEN 650 MG RE SUPP: 650.0000 mg | Freq: Four times a day (QID) | RECTAL | Status: DC | PRN

## 2018-06-20 MED ORDER — ROCURONIUM BROMIDE 50 MG/5ML IV SOLN
INTRAVENOUS | Status: AC
  Filled 2018-06-20: qty 1

## 2018-06-20 MED ORDER — PHENYLEPHRINE HCL (PRESSORS) 10 MG/ML IV SOLN
INTRAVENOUS | Status: DC | PRN
  Administered 2018-06-20 (×6): 100 ug via INTRAVENOUS

## 2018-06-20 MED ORDER — MIDAZOLAM HCL 2 MG/2ML IJ SOLN
INTRAMUSCULAR | Status: DC | PRN
  Administered 2018-06-20: 2 mg via INTRAVENOUS

## 2018-06-20 MED ORDER — ACETAMINOPHEN 10 MG/ML IV SOLN
INTRAVENOUS | Status: DC | PRN
  Administered 2018-06-20: 1000 mg via INTRAVENOUS

## 2018-06-20 MED ORDER — SUCCINYLCHOLINE CHLORIDE 20 MG/ML IJ SOLN
INTRAMUSCULAR | Status: AC
  Filled 2018-06-20: qty 1

## 2018-06-20 MED ORDER — TRAMADOL HCL 50 MG PO TABS: 50.0000 mg | ORAL_TABLET | Freq: Four times a day (QID) | ORAL | Status: DC | PRN

## 2018-06-20 MED ORDER — MIDAZOLAM HCL 2 MG/2ML IJ SOLN
INTRAMUSCULAR | Status: AC
  Filled 2018-06-20: qty 2

## 2018-06-20 MED ORDER — ROCURONIUM BROMIDE 100 MG/10ML IV SOLN
INTRAVENOUS | Status: DC | PRN
  Administered 2018-06-20 (×5): 10 mg via INTRAVENOUS
  Administered 2018-06-20: 30 mg via INTRAVENOUS

## 2018-06-20 MED ORDER — ONDANSETRON HCL 4 MG/2ML IJ SOLN
INTRAMUSCULAR | Status: AC
  Filled 2018-06-20: qty 2

## 2018-06-20 MED ORDER — PHENOL 1.4 % MT LIQD
1.0000 | OROMUCOSAL | Status: DC | PRN
  Administered 2018-06-20: 1 via OROMUCOSAL
  Filled 2018-06-20 (×2): qty 177

## 2018-06-20 MED ORDER — ONDANSETRON 4 MG PO TBDP: 4.0000 mg | ORAL_TABLET | Freq: Four times a day (QID) | ORAL | Status: DC | PRN

## 2018-06-20 MED ORDER — ENOXAPARIN SODIUM 40 MG/0.4ML ~~LOC~~ SOLN
40.0000 mg | SUBCUTANEOUS | Status: DC
  Administered 2018-06-21 – 2018-06-29 (×9): 40 mg via SUBCUTANEOUS
  Filled 2018-06-20 (×9): qty 0.4

## 2018-06-20 MED ORDER — SEVOFLURANE IN SOLN
RESPIRATORY_TRACT | Status: AC
  Filled 2018-06-20: qty 250

## 2018-06-20 MED ORDER — BUPIVACAINE-EPINEPHRINE (PF) 0.5% -1:200000 IJ SOLN
INTRAMUSCULAR | Status: AC
  Filled 2018-06-20: qty 30

## 2018-06-20 MED ORDER — BUPIVACAINE HCL (PF) 0.25 % IJ SOLN
INTRAMUSCULAR | Status: AC
  Filled 2018-06-20: qty 30

## 2018-06-20 MED ORDER — EVICEL 2 ML EX KIT
PACK | CUTANEOUS | Status: DC | PRN
  Administered 2018-06-20: 1

## 2018-06-20 MED ORDER — ONDANSETRON HCL 4 MG/2ML IJ SOLN: 4.0000 mg | Freq: Once | INTRAMUSCULAR | Status: DC | PRN

## 2018-06-20 MED ORDER — DEXAMETHASONE SODIUM PHOSPHATE 10 MG/ML IJ SOLN
INTRAMUSCULAR | Status: DC | PRN
  Administered 2018-06-20: 10 mg via INTRAVENOUS

## 2018-06-20 MED ORDER — FENTANYL CITRATE (PF) 100 MCG/2ML IJ SOLN: 25.0000 ug | INTRAMUSCULAR | Status: DC | PRN

## 2018-06-20 MED ORDER — BUPIVACAINE-EPINEPHRINE 0.5% -1:200000 IJ SOLN
INTRAMUSCULAR | Status: DC | PRN
  Administered 2018-06-20: 10 mL

## 2018-06-20 MED ORDER — CELECOXIB 200 MG PO CAPS
200.0000 mg | ORAL_CAPSULE | Freq: Two times a day (BID) | ORAL | Status: DC
  Administered 2018-06-20 (×2): 200 mg via ORAL
  Filled 2018-06-20 (×5): qty 1

## 2018-06-20 SURGICAL SUPPLY — 54 items
ANCHOR TIS RET SYS 235ML (MISCELLANEOUS) ×4 IMPLANT
BLADE CLIPPER SURG (BLADE) ×4 IMPLANT
BLADE SURG SZ11 CARB STEEL (BLADE) ×8 IMPLANT
BULB RESERV EVAC DRAIN JP 100C (MISCELLANEOUS) ×4 IMPLANT
CANISTER SUCT 1200ML W/VALVE (MISCELLANEOUS) ×4 IMPLANT
CLEANER CAUTERY TIP 5X5 PAD (MISCELLANEOUS) ×2 IMPLANT
COVER WAND RF STERILE (DRAPES) IMPLANT
CUTTER FLEX LINEAR 45M (STAPLE) ×4 IMPLANT
DERMABOND ADVANCED (GAUZE/BANDAGES/DRESSINGS) ×2
DERMABOND ADVANCED .7 DNX12 (GAUZE/BANDAGES/DRESSINGS) ×2 IMPLANT
DRAIN CHANNEL JP 19F (MISCELLANEOUS) ×4 IMPLANT
ELECT REM PT RETURN 9FT ADLT (ELECTROSURGICAL) ×4
ELECTRODE REM PT RTRN 9FT ADLT (ELECTROSURGICAL) ×2 IMPLANT
ENDOLOOP SUT PDS II  0 18 (SUTURE) ×2
ENDOLOOP SUT PDS II 0 18 (SUTURE) ×2 IMPLANT
GLOVE BIOGEL PI IND STRL 7.0 (GLOVE) ×2 IMPLANT
GLOVE BIOGEL PI INDICATOR 7.0 (GLOVE) ×2
GLOVE SURG SYN 6.5 ES PF (GLOVE) ×8 IMPLANT
GOWN STRL REUS W/ TWL LRG LVL3 (GOWN DISPOSABLE) ×4 IMPLANT
GOWN STRL REUS W/TWL LRG LVL3 (GOWN DISPOSABLE) ×4
GRASPER SUT TROCAR 14GX15 (MISCELLANEOUS) IMPLANT
HANDLE YANKAUER SUCT BULB TIP (MISCELLANEOUS) ×16 IMPLANT
IRRIGATION STRYKERFLOW (MISCELLANEOUS) ×2 IMPLANT
IRRIGATOR STRYKERFLOW (MISCELLANEOUS) ×4
KIT PREVENA INCISION MGT20CM45 (CANNISTER) ×4 IMPLANT
KIT TURNOVER KIT A (KITS) ×4 IMPLANT
LIGASURE LAP MARYLAND 5MM 37CM (ELECTROSURGICAL) IMPLANT
NEEDLE HYPO 22GX1.5 SAFETY (NEEDLE) ×8 IMPLANT
NS IRRIG 1000ML POUR BTL (IV SOLUTION) ×8 IMPLANT
NS IRRIG 500ML POUR BTL (IV SOLUTION) ×4 IMPLANT
PACK LAP CHOLECYSTECTOMY (MISCELLANEOUS) ×4 IMPLANT
PAD CLEANER CAUTERY TIP 5X5 (MISCELLANEOUS) ×2
PENCIL ELECTRO HAND CTR (MISCELLANEOUS) ×4 IMPLANT
RELOAD 45 VASCULAR/THIN (ENDOMECHANICALS) ×4 IMPLANT
RELOAD STAPLE TA45 3.5 REG BLU (ENDOMECHANICALS) ×4 IMPLANT
SCISSORS METZENBAUM CVD 33 (INSTRUMENTS) ×4 IMPLANT
SET TUBE SMOKE EVAC HIGH FLOW (TUBING) ×4 IMPLANT
SPONGE DRAIN TRACH 4X4 STRL 2S (GAUZE/BANDAGES/DRESSINGS) ×4 IMPLANT
SPONGE LAP 18X18 RF (DISPOSABLE) ×8 IMPLANT
STAPLER SKIN PROX 35W (STAPLE) ×4 IMPLANT
SUT MNCRL AB 4-0 PS2 18 (SUTURE) ×4 IMPLANT
SUT PDS AB 1 TP1 54 (SUTURE) ×8 IMPLANT
SUT SILK 3 0 SH 30 (SUTURE) ×4 IMPLANT
SUT VIC AB 3-0 SH 27 (SUTURE) ×4
SUT VIC AB 3-0 SH 27X BRD (SUTURE) ×4 IMPLANT
SUT VICRYL 0 AB UR-6 (SUTURE) IMPLANT
SUT VICRYL PLUS ABS 0 54 (SUTURE) ×4 IMPLANT
SYR 50ML LL SCALE MARK (SYRINGE) ×8 IMPLANT
TRAY FOLEY MTR SLVR 16FR STAT (SET/KITS/TRAYS/PACK) ×4 IMPLANT
TROCAR HASSON CONE 8 XI (INSTRUMENTS) ×2 IMPLANT
TROCAR HASSON CONE XI 8MM (INSTRUMENTS) ×2
TROCAR XCEL 12X100 BLDLESS (ENDOMECHANICALS) ×4 IMPLANT
TROCAR XCEL BLUNT TIP 100MML (ENDOMECHANICALS) IMPLANT
TROCAR XCEL NON-BLD 5MMX100MML (ENDOMECHANICALS) ×8 IMPLANT

## 2018-06-20 NOTE — Anesthesia Procedure Notes (Signed)
Procedure Name: Intubation Date/Time: 06/20/2018 4:05 AM Performed by: Irving Burton, CRNA Pre-anesthesia Checklist: Patient identified, Emergency Drugs available, Suction available and Patient being monitored Patient Re-evaluated:Patient Re-evaluated prior to induction Oxygen Delivery Method: Circle system utilized Preoxygenation: Pre-oxygenation with 100% oxygen Induction Type: IV induction, Rapid sequence and Cricoid Pressure applied Laryngoscope Size: McGraph and 4 Grade View: Grade I Tube type: Oral Tube size: 7.5 mm Number of attempts: 1 Airway Equipment and Method: Stylet and Video-laryngoscopy Placement Confirmation: ETT inserted through vocal cords under direct vision,  positive ETCO2 and breath sounds checked- equal and bilateral Secured at: 21 cm Tube secured with: Tape Dental Injury: Teeth and Oropharynx as per pre-operative assessment  Difficulty Due To: Difficulty was anticipated and Difficult Airway- due to anterior larynx

## 2018-06-20 NOTE — Anesthesia Post-op Follow-up Note (Signed)
Anesthesia QCDR form completed.        

## 2018-06-20 NOTE — H&P (Signed)
Subjective:   CC: acute appendicitis  HPI:  Matthew Li is a 43 y.o. male who is consulted by Don Perking for evaluation of  above cc.  Symptoms were first noted 2 days ago. Pain is lower abdomen, sharp, worsening.  Associated with nausea, exacerbated by palpation.     Past Medical History: none reported  Past Surgical History: none reported  Family History: reviewed and not related to CC  Social History:  reports that he has never smoked. He does not have any smokeless tobacco history on file. He reports current alcohol use. No history on file for drug.  Current Medications: none reported  Allergies:  Allergies as of 06/19/2018  . (No Known Allergies)    ROS:  General: Denies weight loss, weight gain, fatigue, fevers, chills, and night sweats. Eyes: Denies blurry vision, double vision, eye pain, itchy eyes, and tearing. Ears: Denies hearing loss, earache, and ringing in ears. Nose: Denies sinus pain, congestion, infections, runny nose, and nosebleeds. Mouth/throat: Denies hoarseness, sore throat, bleeding gums, and difficulty swallowing. Heart: Denies chest pain, palpitations, racing heart, irregular heartbeat, leg pain or swelling, and decreased activity tolerance. Respiratory: Denies breathing difficulty, shortness of breath, wheezing, cough, and sputum. GI: Denies change in appetite, heartburn, vomiting, constipation, diarrhea, and blood in stool. GU: Denies difficulty urinating, pain with urinating, urgency, frequency, blood in urine. Musculoskeletal: Denies joint stiffness, pain, swelling, muscle weakness. Skin: Denies rash, itching, mass, tumors, sores, and boils Neurologic: Denies headache, fainting, dizziness, seizures, numbness, and tingling. Psychiatric: Denies depression, anxiety, difficulty sleeping, and memory loss. Endocrine: Denies heat or cold intolerance, and increased thirst or urination. Blood/lymph: Denies easy bruising, easy bruising, and swollen  glands     Objective:     BP 109/84   Pulse (!) 125   Temp (!) 100.6 F (38.1 C) (Oral)   Resp 20   Wt 75 kg   SpO2 96%   BMI 29.29 kg/m    Constitutional :  alert, cooperative, appears stated age and no distress  Lymphatics/Throat:  no asymmetry, masses, or scars  Respiratory:  clear to auscultation bilaterally  Cardiovascular:  regular rate and rhythm  Gastrointestinal: soft, but focal tenderness in lower abdomen over appendix noted on CT.   Musculoskeletal: Steady gait and movement  Skin: Cool and moist  Psychiatric: Normal affect, non-agitated, not confused       LABS:  CMP Latest Ref Rng & Units 06/19/2018 06/18/2018  Glucose 70 - 99 mg/dL 889(V) 694(H)  BUN 6 - 20 mg/dL 16 03(U)  Creatinine 8.82 - 1.24 mg/dL 8.00 3.49  Sodium 179 - 145 mmol/L 134(L) 136  Potassium 3.5 - 5.1 mmol/L 4.0 3.8  Chloride 98 - 111 mmol/L 97(L) 104  CO2 22 - 32 mmol/L 24 23  Calcium 8.9 - 10.3 mg/dL 9.2 8.9  Total Protein 6.5 - 8.1 g/dL 1.5(A) 7.6  Total Bilirubin 0.3 - 1.2 mg/dL 5.6(P) 1.0  Alkaline Phos 38 - 126 U/L 56 49  AST 15 - 41 U/L 25 36  ALT 0 - 44 U/L 33 45(H)   CBC Latest Ref Rng & Units 06/19/2018 06/18/2018  WBC 4.0 - 10.5 K/uL 14.1(H) 15.0(H)  Hemoglobin 13.0 - 17.0 g/dL 79.4 80.1  Hematocrit 65.5 - 52.0 % 46.7 41.5  Platelets 150 - 400 K/uL 222 204     RADS: CLINICAL DATA:  43 year old male with right abdominal pain since yesterday.  EXAM: CT ABDOMEN AND PELVIS WITH CONTRAST  TECHNIQUE: Multidetector CT imaging of the abdomen and pelvis was  performed using the standard protocol following bolus administration of intravenous contrast.  CONTRAST:  100mL OMNIPAQUE IOHEXOL 300 MG/ML  SOLN  COMPARISON:  None.  FINDINGS: Lower chest: Mild respiratory motion, otherwise negative.  Hepatobiliary: Hepatic steatosis. Negative gallbladder. No bile duct enlargement.  Pancreas: Negative.  Spleen: Negative.  Adrenals/Urinary Tract: Normal adrenal  glands. Normal bilateral renal enhancement and contrast excretion. Retroperitoneal stranding along the course of the ureters at the pelvic inlet, more so the right. Diminutive and unremarkable urinary bladder.  Stomach/Bowel: Decompressed rectosigmoid colon with regional fluid and inflammatory stranding secondary to the appendix. Similar decompressed distal transverse and descending colon. Gas and liquid stool in the proximal transverse and the right colon.  Dilated and inflamed appendix courses into the midline and across into the left abdomen from the cecum. There is a 10 millimeter round appendicoliths at the origin (series 2 images 56 and 58 and coronal images 49 through 46). Confluent regional inflammatory stranding. No extraluminal gas. Regional free fluid including tracking into the distal small bowel mesentery on series 2, image 52. Secondary inflammation of the terminal ileum and other regional small bowel loops. Regional reactive appearing lymphadenopathy in the mesentery.  Appendix: Location: Tracks across midline to the left posterior to small bowel in the upper abdomen.  Diameter: 15 millimeters  Appendicolith: Positive, 10 millimeters  Mucosal hyper-enhancement: Positive  Extraluminal gas: Negative  Periappendiceal collection: Phlegmon and free fluid, no organized collection.  There are mildly dilated fluid-filled small bowel loops in the left abdomen. These have a gradual transition. Largely decompressed and negative stomach. No free air identified.  Vascular/Lymphatic: Major arterial structures are patent and normal. Portal venous system is patent.  No other abnormal lymph nodes  Reproductive: Negative.  Other: Small to moderate volume of pelvic free fluid with simple fluid density (series 2, image 74).  Musculoskeletal: Negative.  IMPRESSION: 1. Positive for Acute Appendicitis. Severely inflamed appendix with 10 mm  appendicolith. Confluent regional inflammation. Moderate secondary inflammation of distal small bowel and upstream small bowel ileus. Free fluid but no perforation suspected. No organized fluid collection or abscess. 2. Hepatic steatosis.   Electronically Signed   By: Odessa FlemingH  Hall M.D.   On: 06/19/2018 22:49 Assessment:      Acute appendicitis  Plan:      Discussed the risk of surgery including post-op infxn, seroma, hematoma, abscess formation, chronic pain, poor-delayed wound healing, possible bowel resection, possible ostomy, possible conversion to open procedure, post-op SBO or ileus, and need for additional procedures to address said risks.  The risks of general anesthetic including MI, CVA, sudden death or even reaction to anesthetic medications also discussed. Alternatives include continued observation, or antibiotic treatment.  Benefits include possible symptom relief,   Typical post operative recovery of 3-5 days rest, also discussed.  The patient understands the risks, any and all questions were answered to the patient's satisfaction. To the OR for lap appy

## 2018-06-20 NOTE — ED Notes (Signed)
Oral swabs given.

## 2018-06-20 NOTE — Transfer of Care (Signed)
Immediate Anesthesia Transfer of Care Note  Patient: Matthew Li  Procedure(s) Performed: APPENDECTOMY LAPAROSCOPIC (N/A ) APPENDECTOMY (N/A Abdomen)  Patient Location: PACU  Anesthesia Type:General  Level of Consciousness: sedated  Airway & Oxygen Therapy: Patient Spontanous Breathing and Patient connected to face mask oxygen  Post-op Assessment: Report given to RN and Post -op Vital signs reviewed and stable  Post vital signs: Reviewed and stable  Last Vitals:  Vitals Value Taken Time  BP    Temp    Pulse 109 06/20/2018  6:53 AM  Resp 25 06/20/2018  6:53 AM  SpO2 94 % 06/20/2018  6:53 AM  Vitals shown include unvalidated device data.  Last Pain:  Vitals:   06/19/18 2235  TempSrc: Oral  PainSc:          Complications: No apparent anesthesia complications

## 2018-06-20 NOTE — OR Nursing (Signed)
Laparoscopic appendectomy was converted to an opened appendectomy

## 2018-06-20 NOTE — Anesthesia Preprocedure Evaluation (Signed)
Anesthesia Evaluation  Patient identified by MRN, date of birth, ID band Patient awake    Reviewed: Allergy & Precautions, H&P , NPO status , Patient's Chart, lab work & pertinent test results, reviewed documented beta blocker date and time   Airway Mallampati: II  TM Distance: >3 FB Neck ROM: full    Dental  (+) Teeth Intact   Pulmonary neg pulmonary ROS,    Pulmonary exam normal        Cardiovascular negative cardio ROS Normal cardiovascular exam Rhythm:regular Rate:Normal     Neuro/Psych negative neurological ROS  negative psych ROS   GI/Hepatic negative GI ROS, Neg liver ROS,   Endo/Other  negative endocrine ROS  Renal/GU negative Renal ROS  negative genitourinary   Musculoskeletal   Abdominal   Peds  Hematology negative hematology ROS (+)   Anesthesia Other Findings History reviewed. No pertinent past medical history. No past surgical history on file. BMI    Body Mass Index:  29.29 kg/m     Reproductive/Obstetrics negative OB ROS                             Anesthesia Physical Anesthesia Plan  ASA: II and emergent  Anesthesia Plan: General ETT   Post-op Pain Management:    Induction:   PONV Risk Score and Plan: 3  Airway Management Planned:   Additional Equipment:   Intra-op Plan:   Post-operative Plan:   Informed Consent: I have reviewed the patients History and Physical, chart, labs and discussed the procedure including the risks, benefits and alternatives for the proposed anesthesia with the patient or authorized representative who has indicated his/her understanding and acceptance.     Dental Advisory Given  Plan Discussed with: CRNA  Anesthesia Plan Comments:         Anesthesia Quick Evaluation

## 2018-06-20 NOTE — Progress Notes (Signed)
Paged. Dr. Tonna Boehringer re: patient wants to speak with him regarding surgery.

## 2018-06-20 NOTE — Op Note (Signed)
Preoperative diagnosis: Acute appendicitis.  Postoperative diagnosis: Acute appendicitis with perforation  Procedure: Laparoscopic converted to open appendectomy.  Anesthesia: GETA  Surgeon: Sung Amabile  Wound Classification: contaminated  Specimen: Appendix  Complications: None  Estimated Blood Loss: 15 mL   Indications: Patient is a 43 y.o. male  presented with lower quadrant pain.  Computed tomography scan and physical examination were consistent with acute appendicitis.   Findings: 1. Acutely inflamed perforated appendix 2.  Large amount of purulent material throughout abdomen 3. Normal anatomy 4. Appendiceal artery ligated and divided with EndoGIA 5.  Endoloop used to secure partial opening of staple line 6.  Adequate hemostasis  Description of procedure: The patient was placed on the operating table in the supine position, left arm tucked. General anesthesia was induced. A time-out was completed verifying correct patient, procedure, site, positioning, and implant(s) and/or special equipment prior to beginning this procedure. A Foley catheter placed. The abdomen was prepped and draped in the usual sterile fashion.   After local was infused, an incision was made inferior to the umbilicus.  The fascia was elevated with Coker's and 0 Vicryl stay sutures were placed on either side of the midline.  11 blade was then used to make an midline incision down through the fascia blunt dissection used to enter the peritoneal cavity under direct visualization.  Hasson port then placed through this and abdomen insufflated with carbon dioxide to a pressure of 15 mmHg. The patient tolerated insufflation well.  The laparoscope was inserted and the abdomen inspected. No injuries from initial trocar placement were noted.  Large amount of purulent material with adhesions between multiple loops of bowel noted. one 34mm port and another 5-mm port was then placed in the LLQ.  Care was taken to avoid  injury to the bladder or inferior epigastric vessels. The table was placed in the Trendelenburg position with the right side elevated.   Attempt was made to identify the appendix within this pool of succus and adhesed bowel, but this proved to be unsuccessful especially within a limited field of view due to dilated bowels.  Decision was made at this point to convert to an open procedure.  The midline incision was extended down to the pubic symphysis and superiorly beyond the umbilicus.  Once adequate exposure was again, the appendix was noted to be deep within all of the loops of bowel within the mid abdomen.  Inspection of the appendix noted a necrotic tip as well as a perforation close to but not adjacent to the base.  Cecum itself was not involved with the perforation and surprisingly had minimal inflammation.  An endoscopic vascular load linear cutting stapler was then used to divide and staple the mesoappendix close to the base. It was reloaded with a blue cartridge and the appendix itself was similarly divided.  The appendix removed off operative field pending pathology.  The appendiceal stump was examined and hemostasis noted.  However, a quarter of the staple line for the appendiceal lumen did not close, and stool was noted within it.  Area was cleared of excess stool and further succus, before closing the staple defect with 0 PDS Endoloop.  Reexamination noted adequate closure of the defect with the Endoloop involving healthy cecal tissue.  EviSeal was applied to the stump to further reinforce the closure.copious irrigation was performed until clear return was noted from the right paracolic gutter and pelvis region.  No other pathology was identified.   Blake drain was placed within the paracolic gutter  and secured to the skin using 3-0 silk.  NG tube placed and confirmed tip in stomach with palpation  Midline wound then closed with 1 PDS x2 in a running fashion after infusing Exparel.  Deep dermal  then closed with 3-0 vicryl interrupted fashion.  Midline incision then closed with staples.  Incision then dressed with Praveena wound VAC.  Left lower quadrant port site was closed with 4-0 Monocryl in a subcuticular fashion and then dressed with Dermabond.   The patient tolerated the procedure well, foley removed, awakened from anesthesia and was taken to the postanesthesia care unit in satisfactory condition.  Sponge count and instrument count correct at the end of the procedure.

## 2018-06-21 ENCOUNTER — Inpatient Hospital Stay: Payer: BC Managed Care – PPO

## 2018-06-21 LAB — BASIC METABOLIC PANEL
Anion gap: 12 (ref 5–15)
BUN: 21 mg/dL — ABNORMAL HIGH (ref 6–20)
CO2: 24 mmol/L (ref 22–32)
Calcium: 8.3 mg/dL — ABNORMAL LOW (ref 8.9–10.3)
Chloride: 100 mmol/L (ref 98–111)
Creatinine, Ser: 0.77 mg/dL (ref 0.61–1.24)
GFR calc Af Amer: >60 mL/min (ref >60)
GFR calc non Af Amer: >60 mL/min (ref >60)
Glucose, Bld: 143 mg/dL — ABNORMAL HIGH (ref 70–99)
Potassium: 3.6 mmol/L (ref 3.5–5.1)
Sodium: 136 mmol/L (ref 135–145)

## 2018-06-21 LAB — CBC
HCT: 41.7 % (ref 39.0–52.0)
Hemoglobin: 14.4 g/dL (ref 13.0–17.0)
MCH: 30.4 pg (ref 26.0–34.0)
MCHC: 34.5 g/dL (ref 30.0–36.0)
MCV: 88 fL (ref 80.0–100.0)
Platelets: 159 10*3/uL (ref 150–400)
RBC: 4.74 MIL/uL (ref 4.22–5.81)
RDW: 13.2 % (ref 11.5–15.5)
WBC: 10.3 10*3/uL (ref 4.0–10.5)
nRBC: 0 % (ref 0.0–0.2)

## 2018-06-21 MED ORDER — KCL IN DEXTROSE-NACL 20-5-0.45 MEQ/L-%-% IV SOLN
INTRAVENOUS | Status: DC
  Administered 2018-06-21 – 2018-06-28 (×13): via INTRAVENOUS
  Filled 2018-06-21 (×18): qty 1000

## 2018-06-21 MED ORDER — KETOROLAC TROMETHAMINE 30 MG/ML IJ SOLN
30.0000 mg | Freq: Four times a day (QID) | INTRAMUSCULAR | Status: AC
  Administered 2018-06-21 – 2018-06-26 (×20): 30 mg via INTRAVENOUS
  Filled 2018-06-21 (×20): qty 1

## 2018-06-21 MED ORDER — SODIUM CHLORIDE 0.9 % IV SOLN
INTRAVENOUS | Status: DC | PRN
  Administered 2018-06-21 – 2018-06-28 (×8): via INTRAVENOUS

## 2018-06-21 MED ORDER — HYDROMORPHONE HCL 1 MG/ML IJ SOLN
0.5000 mg | INTRAMUSCULAR | Status: DC | PRN
  Administered 2018-06-21 – 2018-06-23 (×6): 0.5 mg via INTRAVENOUS
  Filled 2018-06-21 (×6): qty 0.5

## 2018-06-21 NOTE — Progress Notes (Signed)
06/21/2018  Subjective: Patient is 1 Day Post-Op status post laparoscopic converted to open appendectomy due to large amount of purulent material throughout the abdomen.  Patient did have an episode of emesis in spite of NG tube being in place.  KUB was obtained which showed dilated small bowel loops but the NG tube was in the stomach.  Nursing work with flushing the tube better so it would work better overnight.  This morning patient reports that he is feeling somewhat better denies any nausea.  Pain well controlled.  He also did have urinary retention yesterday afternoon and Foley catheter was replaced.  Vital signs: Temp:  [98.2 F (36.8 C)-99.8 F (37.7 C)] 98.2 F (36.8 C) (05/02 1222) Pulse Rate:  [93-109] 93 (05/02 1222) Resp:  [18-20] 18 (05/02 1222) BP: (114-139)/(82-98) 119/82 (05/02 1222) SpO2:  [93 %-96 %] 95 % (05/02 1222)   Intake/Output: 05/01 0701 - 05/02 0700 In: 2392.3 [I.V.:2078.3; NG/GT:80; IV Piggyback:234] Out: 1435 [Urine:850; Emesis/NG output:150; Drains:435] Last BM Date: 06/19/18  Physical Exam: Constitutional: No acute distress Abdomen: Soft, mildly distended, with appropriate tenderness to palpation.  JP drain with serous fluid.  Praveena vac in place with no bloody fluid on the sponge.  Labs:  Recent Labs    06/20/18 0905 06/21/18 0329  WBC 9.1 10.3  HGB 14.1 14.4  HCT 40.7 41.7  PLT 148* 159   Recent Labs    06/19/18 2231 06/20/18 0905 06/21/18 0329  NA 134*  --  136  K 4.0  --  3.6  CL 97*  --  100  CO2 24  --  24  GLUCOSE 165*  --  143*  BUN 16  --  21*  CREATININE 1.14 1.07 0.77  CALCIUM 9.2  --  8.3*   No results for input(s): LABPROT, INR in the last 72 hours.  Imaging: Dg Abd 1 View  Result Date: 06/21/2018 CLINICAL DATA:  NG tube placement EXAM: ABDOMEN - 1 VIEW COMPARISON:  None. FINDINGS: NG tube is in the stomach. Dilated small bowel loops in the visualized left abdomen. No free air. IMPRESSION: NG tube tip in the stomach.  Dilated left abdominal small bowel loops. Electronically Signed   By: Charlett Nose M.D.   On: 06/21/2018 02:56    Assessment/Plan: This is a 43 y.o. male s/p laparoscopic converted to open appendectomy.  - Discussed with the patient further about his surgery and the findings and why it had to be converted to open and what the plan of action will be. -Currently awaiting return of bowel function and will keep NG tube to suction for now.  He may need more frequent flushing to make sure it works well.  Continue monitoring JP drain output to make sure that there is no complications from the staple line from the appendectomy. - Continue Foley catheter as patient today did not want it removed. - Out of bed, ambulate.   Howie Ill, MD [facility]

## 2018-06-22 ENCOUNTER — Encounter: Payer: Self-pay | Admitting: Surgery

## 2018-06-22 LAB — CBC
HCT: 37.5 % — ABNORMAL LOW (ref 39.0–52.0)
Hemoglobin: 12.8 g/dL — ABNORMAL LOW (ref 13.0–17.0)
MCH: 30.1 pg (ref 26.0–34.0)
MCHC: 34.1 g/dL (ref 30.0–36.0)
MCV: 88.2 fL (ref 80.0–100.0)
Platelets: 151 10*3/uL (ref 150–400)
RBC: 4.25 MIL/uL (ref 4.22–5.81)
RDW: 13.2 % (ref 11.5–15.5)
WBC: 5.9 10*3/uL (ref 4.0–10.5)
nRBC: 0 % (ref 0.0–0.2)

## 2018-06-22 LAB — BASIC METABOLIC PANEL
Anion gap: 8 (ref 5–15)
BUN: 25 mg/dL — ABNORMAL HIGH (ref 6–20)
CO2: 26 mmol/L (ref 22–32)
Calcium: 7.5 mg/dL — ABNORMAL LOW (ref 8.9–10.3)
Chloride: 102 mmol/L (ref 98–111)
Creatinine, Ser: 0.87 mg/dL (ref 0.61–1.24)
GFR calc Af Amer: >60 mL/min (ref >60)
GFR calc non Af Amer: >60 mL/min (ref >60)
Glucose, Bld: 132 mg/dL — ABNORMAL HIGH (ref 70–99)
Potassium: 3.7 mmol/L (ref 3.5–5.1)
Sodium: 136 mmol/L (ref 135–145)

## 2018-06-22 MED ORDER — TAMSULOSIN HCL 0.4 MG PO CAPS
0.4000 mg | ORAL_CAPSULE | Freq: Every day | ORAL | Status: DC
  Administered 2018-06-22 – 2018-06-29 (×7): 0.4 mg via ORAL
  Filled 2018-06-22 (×8): qty 1

## 2018-06-22 NOTE — Progress Notes (Signed)
06/22/2018  Subjective: Patient is 2 Days Post-Op status post laparoscopic converted to open appendectomy.  No acute events overnight.  Patient reports that his pain is doing better today.  NG tube output remains high and his JP drain has been serosanguineous.  Feels that he may have passed some small amount of gas but is unsure.  Vital signs: Temp:  [98.5 F (36.9 C)-98.9 F (37.2 C)] 98.7 F (37.1 C) (05/03 1224) Pulse Rate:  [87-95] 92 (05/03 1224) Resp:  [18-20] 20 (05/03 0442) BP: (111-113)/(82-86) 111/84 (05/03 1224) SpO2:  [96 %-97 %] 96 % (05/03 1224)   Intake/Output: 05/02 0701 - 05/03 0700 In: 1178.7 [I.V.:924.7; NG/GT:100; IV Piggyback:154] Out: 1955 [Urine:725; Emesis/NG output:900; Drains:330] Last BM Date: 06/19/18  Physical Exam: Constitutional: No acute distress Abdomen: Soft, mildly distended, appropriate tenderness to palpation.  Midline incision covered with Praveena VAC.  JP drain with serous fluid.  Labs:  Recent Labs    06/21/18 0329 06/22/18 0436  WBC 10.3 5.9  HGB 14.4 12.8*  HCT 41.7 37.5*  PLT 159 151   Recent Labs    06/21/18 0329 06/22/18 0436  NA 136 136  K 3.6 3.7  CL 100 102  CO2 24 26  GLUCOSE 143* 132*  BUN 21* 25*  CREATININE 0.77 0.87  CALCIUM 8.3* 7.5*   No results for input(s): LABPROT, INR in the last 72 hours.  Imaging: No results found.  Assessment/Plan: This is a 43 y.o. male s/p laparoscopic converted to open appendectomy.  - Continue waiting for return of bowel function.  In the meantime continue n.p.o. status with NG tube to suction.  May be that tomorrow he starts having flatus and tube can be clamped or removed. -Continue IV antibiotics. -DC Foley today.  Patient hesitant about removing but will add Flomax as well. - Out of bed, ambulate.   Howie Ill, MD [facility]

## 2018-06-23 ENCOUNTER — Ambulatory Visit: Payer: BC Managed Care – PPO | Admitting: Gastroenterology

## 2018-06-23 LAB — BASIC METABOLIC PANEL
Anion gap: 8 (ref 5–15)
BUN: 16 mg/dL (ref 6–20)
CO2: 23 mmol/L (ref 22–32)
Calcium: 7.7 mg/dL — ABNORMAL LOW (ref 8.9–10.3)
Chloride: 103 mmol/L (ref 98–111)
Creatinine, Ser: 0.85 mg/dL (ref 0.61–1.24)
GFR calc Af Amer: >60 mL/min (ref >60)
GFR calc non Af Amer: >60 mL/min (ref >60)
Glucose, Bld: 116 mg/dL — ABNORMAL HIGH (ref 70–99)
Potassium: 3.7 mmol/L (ref 3.5–5.1)
Sodium: 134 mmol/L — ABNORMAL LOW (ref 135–145)

## 2018-06-23 LAB — SURGICAL PATHOLOGY

## 2018-06-23 LAB — MAGNESIUM: Magnesium: 2.3 mg/dL (ref 1.7–2.4)

## 2018-06-23 MED ORDER — CALCIUM CARBONATE ANTACID 500 MG PO CHEW
800.0000 mg | CHEWABLE_TABLET | Freq: Two times a day (BID) | ORAL | Status: DC
  Administered 2018-06-23 – 2018-06-29 (×13): 800 mg via ORAL
  Filled 2018-06-23 (×13): qty 4

## 2018-06-23 MED ORDER — MORPHINE SULFATE (PF) 2 MG/ML IV SOLN: 2.0000 mg | INTRAVENOUS | Status: DC | PRN

## 2018-06-23 MED ORDER — ACETAMINOPHEN 500 MG PO TABS
500.0000 mg | ORAL_TABLET | Freq: Four times a day (QID) | ORAL | Status: DC
  Administered 2018-06-23 – 2018-06-29 (×23): 500 mg via ORAL
  Filled 2018-06-23 (×23): qty 1

## 2018-06-23 NOTE — Progress Notes (Signed)
Subjective:  CC: Matthew Li is a 43 y.o. male  Hospital stay day 3, 3 Days Post-Op lap converted to open appy for perforated appendicitis, generalized peritonitis  HPI: No new issues overnight.  Still sore.  Denies flatus, BM.  NG is still bothering him.  ROS:  General: Denies weight loss, weight gain, fatigue, fevers, chills, and night sweats. Heart: Denies chest pain, palpitations, racing heart, irregular heartbeat, leg pain or swelling, and decreased activity tolerance. Respiratory: Denies breathing difficulty, shortness of breath, wheezing, cough, and sputum. GI: Denies change in appetite, heartburn, nausea, vomiting, constipation, diarrhea, and blood in stool. GU: Denies difficulty urinating, pain with urinating, urgency, frequency, blood in urine.   Objective:   Temp:  [98.7 F (37.1 C)-100 F (37.8 C)] 98.7 F (37.1 C) (05/04 0423) Pulse Rate:  [82-100] 82 (05/04 0423) Resp:  [20] 20 (05/04 0423) BP: (110-111)/(82-85) 110/85 (05/04 0423) SpO2:  [95 %-96 %] 95 % (05/04 0423)     Height: 5\' 3"  (160 cm) Weight: 75 kg BMI (Calculated): 29.3   Intake/Output this shift:   Intake/Output Summary (Last 24 hours) at 06/23/2018 1105 Last data filed at 06/23/2018 0801 Gross per 24 hour  Intake 2662.39 ml  Output 1280 ml  Net 1382.39 ml    Constitutional :  alert, cooperative, appears stated age and no distress  Respiratory:  clear to auscultation bilaterally  Cardiovascular:  regular rate and rhythm  Gastrointestinal: soft, but distended and tender all four quadrants.  no guarding.  Prevena vac in place.   Skin: Cool and moist.   Psychiatric: Normal affect, non-agitated, not confused       LABS:  CMP Latest Ref Rng & Units 06/23/2018 06/22/2018 06/21/2018  Glucose 70 - 99 mg/dL 038(U) 828(M) 034(J)  BUN 6 - 20 mg/dL 16 17(H) 15(A)  Creatinine 0.61 - 1.24 mg/dL 5.69 7.94 8.01  Sodium 135 - 145 mmol/L 134(L) 136 136  Potassium 3.5 - 5.1 mmol/L 3.7 3.7 3.6  Chloride 98 - 111  mmol/L 103 102 100  CO2 22 - 32 mmol/L 23 26 24   Calcium 8.9 - 10.3 mg/dL 7.7(L) 7.5(L) 8.3(L)  Total Protein 6.5 - 8.1 g/dL - - -  Total Bilirubin 0.3 - 1.2 mg/dL - - -  Alkaline Phos 38 - 126 U/L - - -  AST 15 - 41 U/L - - -  ALT 0 - 44 U/L - - -   CBC Latest Ref Rng & Units 06/22/2018 06/21/2018 06/20/2018  WBC 4.0 - 10.5 K/uL 5.9 10.3 9.1  Hemoglobin 13.0 - 17.0 g/dL 12.8(L) 14.4 14.1  Hematocrit 39.0 - 52.0 % 37.5(L) 41.7 40.7  Platelets 150 - 400 K/uL 151 159 148(L)    RADS: n/a Assessment:   S/p lap converted to open appy for perforated appendicitis and generalized peritonitis.  Now likely have post operative ileus.  Cont. NGT, encourage ambulation, gum/hard candy, pain control, minimize narcotics.  AROBF.  Replace Calcium.

## 2018-06-24 LAB — CBC WITH DIFFERENTIAL/PLATELET
Abs Immature Granulocytes: 0.13 10*3/uL — ABNORMAL HIGH (ref 0.00–0.07)
Basophils Absolute: 0 10*3/uL (ref 0.0–0.1)
Basophils Relative: 0 %
Eosinophils Absolute: 0.4 10*3/uL (ref 0.0–0.5)
Eosinophils Relative: 5 %
HCT: 36.1 % — ABNORMAL LOW (ref 39.0–52.0)
Hemoglobin: 12.4 g/dL — ABNORMAL LOW (ref 13.0–17.0)
Immature Granulocytes: 2 %
Lymphocytes Relative: 16 %
Lymphs Abs: 1.3 10*3/uL (ref 0.7–4.0)
MCH: 30 pg (ref 26.0–34.0)
MCHC: 34.3 g/dL (ref 30.0–36.0)
MCV: 87.4 fL (ref 80.0–100.0)
Monocytes Absolute: 0.6 10*3/uL (ref 0.1–1.0)
Monocytes Relative: 7 %
Neutro Abs: 5.5 10*3/uL (ref 1.7–7.7)
Neutrophils Relative %: 70 %
Platelets: 198 10*3/uL (ref 150–400)
RBC: 4.13 MIL/uL — ABNORMAL LOW (ref 4.22–5.81)
RDW: 12.7 % (ref 11.5–15.5)
WBC: 7.8 10*3/uL (ref 4.0–10.5)
nRBC: 0 % (ref 0.0–0.2)

## 2018-06-24 LAB — BASIC METABOLIC PANEL
Anion gap: 8 (ref 5–15)
BUN: 13 mg/dL (ref 6–20)
CO2: 23 mmol/L (ref 22–32)
Calcium: 8.4 mg/dL — ABNORMAL LOW (ref 8.9–10.3)
Chloride: 105 mmol/L (ref 98–111)
Creatinine, Ser: 0.81 mg/dL (ref 0.61–1.24)
GFR calc Af Amer: >60 mL/min (ref >60)
GFR calc non Af Amer: >60 mL/min (ref >60)
Glucose, Bld: 105 mg/dL — ABNORMAL HIGH (ref 70–99)
Potassium: 3.6 mmol/L (ref 3.5–5.1)
Sodium: 136 mmol/L (ref 135–145)

## 2018-06-24 LAB — PHOSPHORUS: Phosphorus: 4.5 mg/dL (ref 2.5–4.6)

## 2018-06-24 LAB — MAGNESIUM: Magnesium: 2.2 mg/dL (ref 1.7–2.4)

## 2018-06-24 NOTE — Progress Notes (Signed)
SURGICAL PROGRESS NOTE   Hospital Day(s): 4.   Post op day(s): 4 Days Post-Op.   Interval History: Patient seen and examined, no acute events or new complaints overnight. Patient reports having mild pain on abdominal area.  Pain not localized.  Denies fever or chills.  Denies nausea or vomiting but has NGT in place.  Denies passing gas per rectum.  Vital signs in last 24 hours: [min-max] current  Temp:  [98 F (36.7 C)-99.1 F (37.3 C)] 98 F (36.7 C) (05/05 0528) Pulse Rate:  [75-87] 75 (05/05 0528) Resp:  [16-20] 20 (05/05 0528) BP: (113-133)/(87-98) 126/98 (05/05 0528) SpO2:  [97 %-98 %] 97 % (05/05 0528)     Height: 5\' 3"  (160 cm) Weight: 75 kg BMI (Calculated): 29.3   NGT output: 500 mL in 24 hours  Physical Exam:  Constitutional: alert, cooperative and no distress  Respiratory: breathing non-labored at rest  Cardiovascular: regular rate and sinus rhythm  Gastrointestinal: soft, mild-tender, and distended.  Tympanic.  Praveena VAC in place  Labs:  CBC Latest Ref Rng & Units 06/24/2018 06/22/2018 06/21/2018  WBC 4.0 - 10.5 K/uL 7.8 5.9 10.3  Hemoglobin 13.0 - 17.0 g/dL 12.4(L) 12.8(L) 14.4  Hematocrit 39.0 - 52.0 % 36.1(L) 37.5(L) 41.7  Platelets 150 - 400 K/uL 198 151 159   CMP Latest Ref Rng & Units 06/24/2018 06/23/2018 06/22/2018  Glucose 70 - 99 mg/dL 177(N) 165(B) 903(Y)  BUN 6 - 20 mg/dL 13 16 33(X)  Creatinine 0.61 - 1.24 mg/dL 8.32 9.19 1.66  Sodium 135 - 145 mmol/L 136 134(L) 136  Potassium 3.5 - 5.1 mmol/L 3.6 3.7 3.7  Chloride 98 - 111 mmol/L 105 103 102  CO2 22 - 32 mmol/L 23 23 26   Calcium 8.9 - 10.3 mg/dL 0.6(Y) 7.7(L) 7.5(L)  Total Protein 6.5 - 8.1 g/dL - - -  Total Bilirubin 0.3 - 1.2 mg/dL - - -  Alkaline Phos 38 - 126 U/L - - -  AST 15 - 41 U/L - - -  ALT 0 - 44 U/L - - -    Imaging studies: No new pertinent imaging studies   Assessment/Plan:  43 y.o. male with acute perforated appendicitis 4 Days Post-Op s/p laparoscopic converted to open  appendectomy.  Patient developed postop ileus.  Today continue with distended and tympanic.  Still not passing gas.  No major changes on clinical exam since last evaluation.  We will continue with nasogastric tube decompression, IV fluids, pain management.  Encouraged to ambulate.  We will continue with Lovenox as DVT prophylaxis   Gae Gallop, MD

## 2018-06-25 ENCOUNTER — Inpatient Hospital Stay: Payer: BC Managed Care – PPO

## 2018-06-25 LAB — CBC WITH DIFFERENTIAL/PLATELET
Abs Immature Granulocytes: 0.27 10*3/uL — ABNORMAL HIGH (ref 0.00–0.07)
Basophils Absolute: 0 10*3/uL (ref 0.0–0.1)
Basophils Relative: 0 %
Eosinophils Absolute: 0.3 10*3/uL (ref 0.0–0.5)
Eosinophils Relative: 4 %
HCT: 36.8 % — ABNORMAL LOW (ref 39.0–52.0)
Hemoglobin: 12.8 g/dL — ABNORMAL LOW (ref 13.0–17.0)
Immature Granulocytes: 3 %
Lymphocytes Relative: 21 %
Lymphs Abs: 1.9 10*3/uL (ref 0.7–4.0)
MCH: 29.6 pg (ref 26.0–34.0)
MCHC: 34.8 g/dL (ref 30.0–36.0)
MCV: 85.2 fL (ref 80.0–100.0)
Monocytes Absolute: 0.7 10*3/uL (ref 0.1–1.0)
Monocytes Relative: 7 %
Neutro Abs: 5.6 10*3/uL (ref 1.7–7.7)
Neutrophils Relative %: 65 %
Platelets: 229 10*3/uL (ref 150–400)
RBC: 4.32 MIL/uL (ref 4.22–5.81)
RDW: 12.6 % (ref 11.5–15.5)
WBC: 8.7 10*3/uL (ref 4.0–10.5)
nRBC: 0 % (ref 0.0–0.2)

## 2018-06-25 LAB — BASIC METABOLIC PANEL
Anion gap: 8 (ref 5–15)
BUN: 13 mg/dL (ref 6–20)
CO2: 24 mmol/L (ref 22–32)
Calcium: 8.6 mg/dL — ABNORMAL LOW (ref 8.9–10.3)
Chloride: 105 mmol/L (ref 98–111)
Creatinine, Ser: 0.95 mg/dL (ref 0.61–1.24)
GFR calc Af Amer: >60 mL/min (ref >60)
GFR calc non Af Amer: >60 mL/min (ref >60)
Glucose, Bld: 110 mg/dL — ABNORMAL HIGH (ref 70–99)
Potassium: 3.8 mmol/L (ref 3.5–5.1)
Sodium: 137 mmol/L (ref 135–145)

## 2018-06-25 LAB — MAGNESIUM: Magnesium: 2.1 mg/dL (ref 1.7–2.4)

## 2018-06-25 LAB — PHOSPHORUS: Phosphorus: 5.1 mg/dL — ABNORMAL HIGH (ref 2.5–4.6)

## 2018-06-25 MED ORDER — IOHEXOL 240 MG/ML SOLN
25.0000 mL | INTRAMUSCULAR | Status: AC
  Administered 2018-06-25 (×2): 25 mL via ORAL

## 2018-06-25 MED ORDER — IOHEXOL 300 MG/ML  SOLN
100.0000 mL | Freq: Once | INTRAMUSCULAR | Status: AC | PRN
  Administered 2018-06-25: 100 mL via INTRAVENOUS

## 2018-06-25 NOTE — Progress Notes (Signed)
SURGICAL PROGRESS NOTE   Hospital Day(s): 5.   Post op day(s): 5 Days Post-Op.   Interval History: Patient seen and examined, no acute events or new complaints overnight. Patient reports no change in symptoms.  He reports he continued feeling bloated, distended with mild pain.  Denies passing gas or passing bowel movement.  Vital signs in last 24 hours: [min-max] current  Temp:  [98.4 F (36.9 C)-98.8 F (37.1 C)] 98.8 F (37.1 C) (05/06 0511) Pulse Rate:  [79-86] 79 (05/06 0511) Resp:  [16-20] 20 (05/06 0511) BP: (118-134)/(89-99) 118/89 (05/06 0511) SpO2:  [96 %-98 %] 96 % (05/06 0511)     Height: 5\' 3"  (160 cm) Weight: 75 kg BMI (Calculated): 29.3   NGT: 900 milliliters in 24 hours  Physical Exam:  Constitutional: alert, cooperative and no distress  Respiratory: breathing non-labored at rest  Cardiovascular: regular rate and sinus rhythm  Gastrointestinal: soft, mild-tender, distended and tympanic.  There is no rebound tenderness or guarding  Labs:  CBC Latest Ref Rng & Units 06/25/2018 06/24/2018 06/22/2018  WBC 4.0 - 10.5 K/uL 8.7 7.8 5.9  Hemoglobin 13.0 - 17.0 g/dL 12.8(L) 12.4(L) 12.8(L)  Hematocrit 39.0 - 52.0 % 36.8(L) 36.1(L) 37.5(L)  Platelets 150 - 400 K/uL 229 198 151   CMP Latest Ref Rng & Units 06/25/2018 06/24/2018 06/23/2018  Glucose 70 - 99 mg/dL 629(U) 765(Y) 650(P)  BUN 6 - 20 mg/dL 13 13 16   Creatinine 0.61 - 1.24 mg/dL 5.46 5.68 1.27  Sodium 135 - 145 mmol/L 137 136 134(L)  Potassium 3.5 - 5.1 mmol/L 3.8 3.6 3.7  Chloride 98 - 111 mmol/L 105 105 103  CO2 22 - 32 mmol/L 24 23 23   Calcium 8.9 - 10.3 mg/dL 5.1(Z) 0.0(F) 7.7(L)  Total Protein 6.5 - 8.1 g/dL - - -  Total Bilirubin 0.3 - 1.2 mg/dL - - -  Alkaline Phos 38 - 126 U/L - - -  AST 15 - 41 U/L - - -  ALT 0 - 44 U/L - - -    Imaging studies: No new pertinent imaging studies   Assessment/Plan:  43 y.o. male with acute perforated appendicitis 5 Days Post-Op s/p laparoscopic converted to open  appendectomy.  With persistent postoperative ileus.  Even though there is no fever, no tachycardia and normal white blood cell count.  The sign of persistent ileus without any change in his clinical status is concerning for possible intra-abdominal abscess that can be causing this delay in return of bowel function.  I will order an abdominal pelvic CT scan for evaluation of that matter.  We will continue with current pain management.  We will continue with DVT prophylaxis and encourage patient to ambulate.  We will continue with IV antibiotic therapy due to the significant amount of infection that was found during the surgery.  We will continue with NGT and suction for decompression and IV fluid to keep patient hydrated.  There is no significant electrolyte disturbance that explain extended ileus.  Gae Gallop, MD

## 2018-06-25 NOTE — Progress Notes (Signed)
Interventional Radiology Progress Note  CT reviewed for possible drainage procedure. 3 cm collection anterior to rectum in deep pelvis cannot be currently safely approached for percutaneous drainage due to position and size. Tiny amount of fluid near appendectomy site is not significant and may simply be normal post-op fluid.  Jodi Marble. Fredia Sorrow, M.D Pager:  [phone]

## 2018-06-25 NOTE — Anesthesia Postprocedure Evaluation (Signed)
Anesthesia Post Note  Patient: Matthew Li  Procedure(s) Performed: APPENDECTOMY LAPAROSCOPIC (N/A ) APPENDECTOMY (N/A Abdomen)  Patient location during evaluation: PACU Anesthesia Type: General Level of consciousness: awake and alert Pain management: pain level controlled Vital Signs Assessment: post-procedure vital signs reviewed and stable Respiratory status: spontaneous breathing, nonlabored ventilation, respiratory function stable and patient connected to nasal cannula oxygen Cardiovascular status: blood pressure returned to baseline and stable Postop Assessment: no apparent nausea or vomiting Anesthetic complications: no     Last Vitals:  Vitals:   06/25/18 0511 06/25/18 1311  BP: 118/89 (!) 135/98  Pulse: 79 77  Resp: 20 20  Temp: 37.1 C 36.7 C  SpO2: 96% 98%    Last Pain:  Vitals:   06/25/18 1600  TempSrc:   PainSc: 0-No pain                 Yevette Edwards

## 2018-06-26 ENCOUNTER — Inpatient Hospital Stay: Payer: BC Managed Care – PPO

## 2018-06-26 LAB — BASIC METABOLIC PANEL
Anion gap: 11 (ref 5–15)
BUN: 12 mg/dL (ref 6–20)
CO2: 23 mmol/L (ref 22–32)
Calcium: 8.9 mg/dL (ref 8.9–10.3)
Chloride: 102 mmol/L (ref 98–111)
Creatinine, Ser: 1.05 mg/dL (ref 0.61–1.24)
GFR calc Af Amer: >60 mL/min (ref >60)
GFR calc non Af Amer: >60 mL/min (ref >60)
Glucose, Bld: 109 mg/dL — ABNORMAL HIGH (ref 70–99)
Potassium: 3.9 mmol/L (ref 3.5–5.1)
Sodium: 136 mmol/L (ref 135–145)

## 2018-06-26 LAB — CBC WITH DIFFERENTIAL/PLATELET
Abs Immature Granulocytes: 0.33 10*3/uL — ABNORMAL HIGH (ref 0.00–0.07)
Basophils Absolute: 0 10*3/uL (ref 0.0–0.1)
Basophils Relative: 1 %
Eosinophils Absolute: 0.4 10*3/uL (ref 0.0–0.5)
Eosinophils Relative: 4 %
HCT: 39.2 % (ref 39.0–52.0)
Hemoglobin: 13.5 g/dL (ref 13.0–17.0)
Immature Granulocytes: 4 %
Lymphocytes Relative: 24 %
Lymphs Abs: 2.1 10*3/uL (ref 0.7–4.0)
MCH: 29.4 pg (ref 26.0–34.0)
MCHC: 34.4 g/dL (ref 30.0–36.0)
MCV: 85.4 fL (ref 80.0–100.0)
Monocytes Absolute: 0.7 10*3/uL (ref 0.1–1.0)
Monocytes Relative: 8 %
Neutro Abs: 5.2 10*3/uL (ref 1.7–7.7)
Neutrophils Relative %: 59 %
Platelets: 270 10*3/uL (ref 150–400)
RBC: 4.59 MIL/uL (ref 4.22–5.81)
RDW: 12.8 % (ref 11.5–15.5)
WBC: 8.8 10*3/uL (ref 4.0–10.5)
nRBC: 0 % (ref 0.0–0.2)

## 2018-06-26 LAB — PHOSPHORUS: Phosphorus: 4.9 mg/dL — ABNORMAL HIGH (ref 2.5–4.6)

## 2018-06-26 LAB — MAGNESIUM: Magnesium: 2.3 mg/dL (ref 1.7–2.4)

## 2018-06-26 NOTE — Progress Notes (Signed)
Initial Nutrition Assessment  DOCUMENTATION CODES:   Not applicable  INTERVENTION:   RD will monitor for diet advancement vs the need for nutrition support  Recommend TPN if unable to advance diet in the next 1-2 days  Pt likely at high refeed risk; recommend monitor K, Mg and P labs daily   NUTRITION DIAGNOSIS:   Inadequate oral intake related to acute illness as evidenced by NPO status.  GOAL:   Patient will meet greater than or equal to 90% of their needs  MONITOR:   Diet advancement, Labs, Weight trends, Skin, I & O's  REASON FOR ASSESSMENT:   NPO/Clear Liquid Diet    ASSESSMENT:   43 y.o. male with acute perforated appendicitis s/p laparoscopic converted to open appendectomy 5/1  RD working remotely.  Pt with post op ileus. Per chart review, pt NPO since admit and now without adequate nutrition for 7 days. RD will monitor for diet advancement vs the need for nutrition support. Recommend TPN if unable to advance diet in the next 1-2 days. Pt likely at high refeed risk. There is no weight history in chart to determine if any significant weight loss pta. There is no new weight since 5/1; will request weekly weights.   Medications reviewed and include: tums, lovenox, protonix  Labs reviewed: K 3.9 wnl, P 4.9(H), Mg 2.3 wnl  Unable to complete Nutrition-Focused physical exam at this time.   Diet Order:   Diet Order            Diet NPO time specified Except for: Sips with Meds, Ice Chips  Diet effective now             EDUCATION NEEDS:   No education needs have been identified at this time  Skin:  Skin Assessment: Reviewed RN Assessment(incision abdomen )  Last BM:  5/7- type 7  Height:   Ht Readings from Last 1 Encounters:  06/20/18 5\' 3"  (1.6 m)    Weight:   Wt Readings from Last 1 Encounters:  06/20/18 75 kg    Ideal Body Weight:  56.3 kg  BMI:  Body mass index is 29.29 kg/m.  Estimated Nutritional Needs:   Kcal:  1800-2100kcal/day    Protein:  90-105g/day   Fluid:  >1.7L/day   Betsey Holiday MS, RD, LDN Pager #- [phone] Office#- [phone] After Hours Pager: [phone]

## 2018-06-26 NOTE — Progress Notes (Signed)
SURGICAL PROGRESS NOTE   Hospital Day(s): 6.   Post op day(s): 6 Days Post-Op.   Interval History: Patient seen and examined, no acute events or new complaints overnight. Patient reports feeling the same.  He endorses had a diarrhea-like bowel movement but he says that he is not passing gas.  His abdomen continue to be distended and tympanic.  Vital signs in last 24 hours: [min-max] current  Temp:  [98.1 F (36.7 C)-98.8 F (37.1 C)] 98.8 F (37.1 C) (05/07 0500) Pulse Rate:  [77-83] 80 (05/07 0500) Resp:  [17-20] 20 (05/07 0500) BP: (130-138)/(90-98) 130/91 (05/07 0500) SpO2:  [97 %-98 %] 98 % (05/07 0500)     Height: 5\' 3"  (160 cm) Weight: 75 kg BMI (Calculated): 29.3   NGT: 1350 mL in 24 hours  Physical Exam:  Constitutional: alert, cooperative and no distress  Respiratory: breathing non-labored at rest  Cardiovascular: regular rate and sinus rhythm  Gastrointestinal: soft, non-tender, and distended, tympanic.  Wound covered with Praveena VAC  Labs:  CBC Latest Ref Rng & Units 06/26/2018 06/25/2018 06/24/2018  WBC 4.0 - 10.5 K/uL 8.8 8.7 7.8  Hemoglobin 13.0 - 17.0 g/dL 02.6 12.8(L) 12.4(L)  Hematocrit 39.0 - 52.0 % 39.2 36.8(L) 36.1(L)  Platelets 150 - 400 K/uL 270 229 198   CMP Latest Ref Rng & Units 06/26/2018 06/25/2018 06/24/2018  Glucose 70 - 99 mg/dL 378(H) 885(O) 277(A)  BUN 6 - 20 mg/dL 12 13 13   Creatinine 0.61 - 1.24 mg/dL 1.28 7.86 7.67  Sodium 135 - 145 mmol/L 136 137 136  Potassium 3.5 - 5.1 mmol/L 3.9 3.8 3.6  Chloride 98 - 111 mmol/L 102 105 105  CO2 22 - 32 mmol/L 23 24 23   Calcium 8.9 - 10.3 mg/dL 8.9 2.0(N) 4.7(S)  Total Protein 6.5 - 8.1 g/dL - - -  Total Bilirubin 0.3 - 1.2 mg/dL - - -  Alkaline Phos 38 - 126 U/L - - -  AST 15 - 41 U/L - - -  ALT 0 - 44 U/L - - -    Imaging studies: I personally reviewed the images of the CT scan yesterday.  There is a small collection in the pelvis.  There is a tiny less than 1 cm fluid collection in the area of the  appendix.  There is no free air or free fluid.   Assessment/Plan:  42 y.o.malewith acute perforated appendicitis6 Days Post-Ops/p laparoscopic converted to open appendectomy. Patient's physical exam with persistent postoperative ileus.  Abdominal for the CT scan yesterday shows a 3 cm small abscess in the pelvis.  It was discussed with interventional radiology but it is too small and localization is difficult to access.  We will continue with IV antibiotics.  We did size of the abscess patient should respond to IV antibiotic therapy.  There is no white blood cell count elevation, there is no fever and no tachycardia.  There is no significant electrolyte imbalance.  We will continue with NGT decompression, bowel rest, IV fluid for hydration.  I encourage patient to ambulate.  Will order x-ray later today to see if contrast has moved through the bowel.  Gae Gallop, MD

## 2018-06-27 LAB — CBC WITH DIFFERENTIAL/PLATELET
Abs Immature Granulocytes: 0.28 10*3/uL — ABNORMAL HIGH (ref 0.00–0.07)
Basophils Absolute: 0 10*3/uL (ref 0.0–0.1)
Basophils Relative: 0 %
Eosinophils Absolute: 0.3 10*3/uL (ref 0.0–0.5)
Eosinophils Relative: 2 %
HCT: 38.9 % — ABNORMAL LOW (ref 39.0–52.0)
Hemoglobin: 13.5 g/dL (ref 13.0–17.0)
Immature Granulocytes: 3 %
Lymphocytes Relative: 21 %
Lymphs Abs: 2.4 10*3/uL (ref 0.7–4.0)
MCH: 29.9 pg (ref 26.0–34.0)
MCHC: 34.7 g/dL (ref 30.0–36.0)
MCV: 86.3 fL (ref 80.0–100.0)
Monocytes Absolute: 0.7 10*3/uL (ref 0.1–1.0)
Monocytes Relative: 6 %
Neutro Abs: 7.6 10*3/uL (ref 1.7–7.7)
Neutrophils Relative %: 68 %
Platelets: 325 10*3/uL (ref 150–400)
RBC: 4.51 MIL/uL (ref 4.22–5.81)
RDW: 12.9 % (ref 11.5–15.5)
WBC: 11.2 10*3/uL — ABNORMAL HIGH (ref 4.0–10.5)
nRBC: 0 % (ref 0.0–0.2)

## 2018-06-27 LAB — BASIC METABOLIC PANEL
Anion gap: 11 (ref 5–15)
BUN: 11 mg/dL (ref 6–20)
CO2: 22 mmol/L (ref 22–32)
Calcium: 8.8 mg/dL — ABNORMAL LOW (ref 8.9–10.3)
Chloride: 104 mmol/L (ref 98–111)
Creatinine, Ser: 1.08 mg/dL (ref 0.61–1.24)
GFR calc Af Amer: >60 mL/min (ref >60)
GFR calc non Af Amer: >60 mL/min (ref >60)
Glucose, Bld: 108 mg/dL — ABNORMAL HIGH (ref 70–99)
Potassium: 4 mmol/L (ref 3.5–5.1)
Sodium: 137 mmol/L (ref 135–145)

## 2018-06-27 LAB — MAGNESIUM: Magnesium: 2.3 mg/dL (ref 1.7–2.4)

## 2018-06-27 LAB — PHOSPHORUS: Phosphorus: 4.1 mg/dL (ref 2.5–4.6)

## 2018-06-27 MED ORDER — ORAL CARE MOUTH RINSE
15.0000 mL | Freq: Two times a day (BID) | OROMUCOSAL | Status: DC
  Administered 2018-06-27 – 2018-06-29 (×4): 15 mL via OROMUCOSAL

## 2018-06-27 MED ORDER — CHLORHEXIDINE GLUCONATE 0.12 % MT SOLN
15.0000 mL | Freq: Two times a day (BID) | OROMUCOSAL | Status: DC
  Administered 2018-06-27 – 2018-06-29 (×4): 15 mL via OROMUCOSAL
  Filled 2018-06-27 (×5): qty 15

## 2018-06-27 NOTE — Progress Notes (Signed)
Subjective:  CC: Matthew Li is a 43 y.o. male  Hospital stay day 7, 7 Days Post-Op lap converted to open appy for perforated appendicitis, generalized peritonitis  HPI: No new issues overnight.  Small BM recorded yesterday, and passing little gas today.  Still feels sore and distended.  ROS:  General: Denies weight loss, weight gain, fatigue, fevers, chills, and night sweats. Heart: Denies chest pain, palpitations, racing heart, irregular heartbeat, leg pain or swelling, and decreased activity tolerance. Respiratory: Denies breathing difficulty, shortness of breath, wheezing, cough, and sputum. GI: Denies change in appetite, heartburn, nausea, vomiting, constipation, diarrhea, and blood in stool. GU: Denies difficulty urinating, pain with urinating, urgency, frequency, blood in urine.   Objective:   Temp:  [97.7 F (36.5 C)-98.2 F (36.8 C)] 97.7 F (36.5 C) (05/08 1220) Pulse Rate:  [83-88] 88 (05/08 1220) Resp:  [16-18] 16 (05/08 1220) BP: (104-125)/(71-96) 125/96 (05/08 1220) SpO2:  [97 %-98 %] 98 % (05/08 1220)     Height: 5\' 3"  (160 cm) Weight: 75 kg BMI (Calculated): 29.3   Intake/Output this shift:   Intake/Output Summary (Last 24 hours) at 06/27/2018 1344 Last data filed at 06/27/2018 0746 Gross per 24 hour  Intake 5714.71 ml  Output 2220 ml  Net 3494.71 ml    Constitutional :  alert, cooperative, appears stated age and no distress  Respiratory:  clear to auscultation bilaterally  Cardiovascular:  regular rate and rhythm  Gastrointestinal: soft, slight improvement in distention, less tender in all quardants except in RLQ, which is slightly worse?  no guarding.  Prevena vac removed and staples c/di/i.  Slight blue discoloration at umbilicus but no overt ischemia..  JP drain with serosanguinous fluid.  Patent NG with no output after clamped for three hours.   Skin: Cool and moist.   Psychiatric: Normal affect, non-agitated, not confused       LABS:  CMP Latest Ref  Rng & Units 06/27/2018 06/26/2018 06/25/2018  Glucose 70 - 99 mg/dL 161(W108(H) 960(A109(H) 540(J110(H)  BUN 6 - 20 mg/dL 11 12 13   Creatinine 0.61 - 1.24 mg/dL 8.111.08 9.141.05 7.820.95  Sodium 135 - 145 mmol/L 137 136 137  Potassium 3.5 - 5.1 mmol/L 4.0 3.9 3.8  Chloride 98 - 111 mmol/L 104 102 105  CO2 22 - 32 mmol/L 22 23 24   Calcium 8.9 - 10.3 mg/dL 9.5(A8.8(L) 8.9 2.1(H8.6(L)  Total Protein 6.5 - 8.1 g/dL - - -  Total Bilirubin 0.3 - 1.2 mg/dL - - -  Alkaline Phos 38 - 126 U/L - - -  AST 15 - 41 U/L - - -  ALT 0 - 44 U/L - - -   CBC Latest Ref Rng & Units 06/27/2018 06/26/2018 06/25/2018  WBC 4.0 - 10.5 K/uL 11.2(H) 8.8 8.7  Hemoglobin 13.0 - 17.0 g/dL 08.613.5 57.813.5 12.8(L)  Hematocrit 39.0 - 52.0 % 38.9(L) 39.2 36.8(L)  Platelets 150 - 400 K/uL 325 270 229    RADS: n/a Assessment:   S/p lap converted to open appy for perforated appendicitis and generalized peritonitis.  Now likely have post operative ileus.  Resolving ileus.  NG with no output after clamped, slightly improved abdominal distention on exam, and contrast noted in colon on yesterday's KUB.  NG and Prevena VAC removed at bedside without any issues.  Ice chips and sips to see how he tolerates.    Will need to keep eye on slightly increased wbc today.  No clinical sign of worsening sepsis.  Continue abx for unaccessible intra-abdominal  abscess noted on CT.  JP output quality reassuring today, continue for now.

## 2018-06-27 NOTE — Plan of Care (Signed)
NG tube and wound vac removed today by MD.  Pt did not pass flatus today per pt.  Pt reported feeling bloated and abdominal discomfort, but overall feels a little better today.  Denies n/v. Reported abd pain in am, declined pain med. Ambulated around the nursing station with a walker and standby assist. Pt educated about incentive spirometer and encouraged to use it.

## 2018-06-28 LAB — CBC WITH DIFFERENTIAL/PLATELET
Abs Immature Granulocytes: 0.23 10*3/uL — ABNORMAL HIGH (ref 0.00–0.07)
Basophils Absolute: 0.1 10*3/uL (ref 0.0–0.1)
Basophils Relative: 1 %
Eosinophils Absolute: 0.2 10*3/uL (ref 0.0–0.5)
Eosinophils Relative: 2 %
HCT: 39.9 % (ref 39.0–52.0)
Hemoglobin: 13.6 g/dL (ref 13.0–17.0)
Immature Granulocytes: 2 %
Lymphocytes Relative: 24 %
Lymphs Abs: 2.3 10*3/uL (ref 0.7–4.0)
MCH: 29.8 pg (ref 26.0–34.0)
MCHC: 34.1 g/dL (ref 30.0–36.0)
MCV: 87.5 fL (ref 80.0–100.0)
Monocytes Absolute: 0.6 10*3/uL (ref 0.1–1.0)
Monocytes Relative: 6 %
Neutro Abs: 6.3 10*3/uL (ref 1.7–7.7)
Neutrophils Relative %: 65 %
Platelets: 323 10*3/uL (ref 150–400)
RBC: 4.56 MIL/uL (ref 4.22–5.81)
RDW: 12.9 % (ref 11.5–15.5)
WBC: 9.7 10*3/uL (ref 4.0–10.5)
nRBC: 0 % (ref 0.0–0.2)

## 2018-06-28 LAB — BASIC METABOLIC PANEL
Anion gap: 8 (ref 5–15)
BUN: 11 mg/dL (ref 6–20)
CO2: 24 mmol/L (ref 22–32)
Calcium: 8.8 mg/dL — ABNORMAL LOW (ref 8.9–10.3)
Chloride: 106 mmol/L (ref 98–111)
Creatinine, Ser: 1.11 mg/dL (ref 0.61–1.24)
GFR calc Af Amer: >60 mL/min (ref >60)
GFR calc non Af Amer: >60 mL/min (ref >60)
Glucose, Bld: 113 mg/dL — ABNORMAL HIGH (ref 70–99)
Potassium: 4.2 mmol/L (ref 3.5–5.1)
Sodium: 138 mmol/L (ref 135–145)

## 2018-06-28 LAB — PHOSPHORUS: Phosphorus: 4.3 mg/dL (ref 2.5–4.6)

## 2018-06-28 LAB — MAGNESIUM: Magnesium: 2.4 mg/dL (ref 1.7–2.4)

## 2018-06-28 MED ORDER — SIMETHICONE 80 MG PO CHEW
80.0000 mg | CHEWABLE_TABLET | Freq: Four times a day (QID) | ORAL | Status: DC
  Administered 2018-06-28 – 2018-06-29 (×4): 80 mg via ORAL
  Filled 2018-06-28 (×7): qty 1

## 2018-06-28 NOTE — Progress Notes (Signed)
Subjective:  CC: Matthew Li is a 43 y.o. male  Hospital stay day 8, 8 Days Post-Op lap converted to open appy for perforated appendicitis, generalized peritonitis  HPI: No new issues overnight.  Passing more gas today.  Distention feels slightly better.  ROS:  General: Denies weight loss, weight gain, fatigue, fevers, chills, and night sweats. Heart: Denies chest pain, palpitations, racing heart, irregular heartbeat, leg pain or swelling, and decreased activity tolerance. Respiratory: Denies breathing difficulty, shortness of breath, wheezing, cough, and sputum. GI: Denies change in appetite, heartburn, nausea, vomiting, constipation, diarrhea, and blood in stool. GU: Denies difficulty urinating, pain with urinating, urgency, frequency, blood in urine.   Objective:   Temp:  [97.9 F (36.6 C)-99.1 F (37.3 C)] 98.1 F (36.7 C) (05/09 1405) Pulse Rate:  [66-91] 91 (05/09 1405) Resp:  [16-18] 18 (05/09 1405) BP: (99-116)/(73-82) 111/73 (05/09 1405) SpO2:  [98 %-99 %] 98 % (05/09 1405)     Height: 5\' 3"  (160 cm) Weight: 75 kg BMI (Calculated): 29.3   Intake/Output this shift:   Intake/Output Summary (Last 24 hours) at 06/28/2018 1417 Last data filed at 06/28/2018 2956 Gross per 24 hour  Intake 1028.8 ml  Output 1155 ml  Net -126.2 ml    Constitutional :  alert, cooperative, appears stated age and no distress  Respiratory:  clear to auscultation bilaterally  Cardiovascular:  regular rate and rhythm  Gastrointestinal: soft, continued improvement in distention, minimal tender in all quardants no guarding.  Staples c/di/i.  Slight blue discoloration at umbilicus but no overt ischemia..  JP drain with serosanguinous fluid.  .   Skin: Cool and moist.   Psychiatric: Normal affect, non-agitated, not confused       LABS:  CMP Latest Ref Rng & Units 06/28/2018 06/27/2018 06/26/2018  Glucose 70 - 99 mg/dL 213(Y) 865(H) 846(N)  BUN 6 - 20 mg/dL 11 11 12   Creatinine 0.61 - 1.24 mg/dL 6.29  5.28 4.13  Sodium 135 - 145 mmol/L 138 137 136  Potassium 3.5 - 5.1 mmol/L 4.2 4.0 3.9  Chloride 98 - 111 mmol/L 106 104 102  CO2 22 - 32 mmol/L 24 22 23   Calcium 8.9 - 10.3 mg/dL 2.4(M) 0.1(U) 8.9  Total Protein 6.5 - 8.1 g/dL - - -  Total Bilirubin 0.3 - 1.2 mg/dL - - -  Alkaline Phos 38 - 126 U/L - - -  AST 15 - 41 U/L - - -  ALT 0 - 44 U/L - - -   CBC Latest Ref Rng & Units 06/28/2018 06/27/2018 06/26/2018  WBC 4.0 - 10.5 K/uL 9.7 11.2(H) 8.8  Hemoglobin 13.0 - 17.0 g/dL 27.2 53.6 64.4  Hematocrit 39.0 - 52.0 % 39.9 38.9(L) 39.2  Platelets 150 - 400 K/uL 323 325 270    RADS: n/a Assessment:   S/p lap converted to open appy for perforated appendicitis and generalized peritonitis.  Now likely with resolving post operative ileus.  Resolving ileus.  Tolerated for 24 hours of NG removal, with no significant worsening in exam or clinical history.  Will advance to clear liquid diet and continue to monitor.  Add simethicone for gas/bloating type sensation that is persistent.  Continue to encourage ambulation gum and hard candy.

## 2018-06-29 LAB — PHOSPHORUS: Phosphorus: 4.1 mg/dL (ref 2.5–4.6)

## 2018-06-29 MED ORDER — DOCUSATE SODIUM 100 MG PO CAPS: 100.0000 mg | ORAL_CAPSULE | Freq: Two times a day (BID) | ORAL | 0 refills | Status: AC | PRN

## 2018-06-29 MED ORDER — ACETAMINOPHEN 325 MG PO TABS: 650.0000 mg | ORAL_TABLET | Freq: Three times a day (TID) | ORAL | 0 refills | Status: DC | PRN

## 2018-06-29 MED ORDER — AMOXICILLIN-POT CLAVULANATE 875-125 MG PO TABS: 1.0000 | ORAL_TABLET | Freq: Two times a day (BID) | ORAL | 0 refills | Status: AC

## 2018-06-29 MED ORDER — ACETAMINOPHEN 500 MG PO TABS: 500.0000 mg | ORAL_TABLET | Freq: Four times a day (QID) | ORAL | Status: DC | PRN

## 2018-06-29 MED ORDER — IBUPROFEN 800 MG PO TABS: 800.0000 mg | ORAL_TABLET | Freq: Three times a day (TID) | ORAL | 0 refills | Status: DC | PRN

## 2018-06-29 MED ORDER — AMOXICILLIN-POT CLAVULANATE 875-125 MG PO TABS
1.0000 | ORAL_TABLET | Freq: Two times a day (BID) | ORAL | Status: DC
  Administered 2018-06-29: 1 via ORAL
  Filled 2018-06-29: qty 1

## 2018-06-29 MED ORDER — HYDROCODONE-ACETAMINOPHEN 5-325 MG PO TABS: 1.0000 | ORAL_TABLET | Freq: Four times a day (QID) | ORAL | 0 refills | Status: AC | PRN

## 2018-06-29 NOTE — Discharge Instructions (Signed)
Laparoscopic Cholecystectomy, Care After This sheet gives you information about how to care for yourself after your procedure. Your doctor may also give you more specific instructions. If you have problems or questions, contact your doctor. Follow these instructions at home: Care for cuts from surgery (incisions)   Follow instructions from your doctor about how to take care of your cuts from surgery. Make sure you: ? Wash your hands with soap and water before you change your bandage (dressing). If you cannot use soap and water, use hand sanitizer. ? Change your bandage as told by your doctor. ? Leave stitches (sutures), skin glue, or skin tape (adhesive) strips in place. They may need to stay in place for 2 weeks or longer. If tape strips get loose and curl up, you may trim the loose edges. Do not remove tape strips completely unless your doctor says it is okay.  Do not take baths, swim, or use a hot tub until your doctor says it is okay. OK TO SHOWER   Drain and record JP output as directed  Check your surgical cut area every day for signs of infection. Check for: ? More redness, swelling, or pain. ? More fluid or blood. ? Warmth. ? Pus or a bad smell. Activity  Do not drive or use heavy machinery while taking prescription pain medicine.  Do not play contact sports until your doctor says it is okay.  Do not drive for 24 hours if you were given a medicine to help you relax (sedative).  Rest as needed. Do not return to work or school until your doctor says it is okay. General instructions   tylenol and advil as needed for discomfort.  Please alternate between the two every four hours as needed for pain.     Use narcotics, if prescribed, only when tylenol and motrin is not enough to control pain.   325-650mg  every 8hrs to max of 4000mg /24hrs (including the 325mg  in every norco dose) for the tylenol.     Advil up to 800mg  per dose every 8hrs as needed for pain.    To prevent or  treat constipation while you are taking prescription pain medicine, your doctor may recommend that you: ? Drink enough fluid to keep your pee (urine) clear or pale yellow. ? Take over-the-counter or prescription medicines. ? Eat foods that are high in fiber, such as fresh fruits and vegetables, whole grains, and beans. ? Limit foods that are high in fat and processed sugars, such as fried and sweet foods. Contact a doctor if:  You develop a rash.  You have more redness, swelling, or pain around your surgical cuts.  You have more fluid or blood coming from your surgical cuts.  Your surgical cuts feel warm to the touch.  You have pus or a bad smell coming from your surgical cuts.  You have a fever.  One or more of your surgical cuts breaks open. Get help right away if:  You have trouble breathing.  You have chest pain.  You have pain that is getting worse in your shoulders.  You faint or feel dizzy when you stand.  You have very bad pain in your belly (abdomen).  You are sick to your stomach (nauseous) for more than one day.  You have throwing up (vomiting) that lasts for more than one day.  You have leg pain. This information is not intended to replace advice given to you by your health care provider. Make sure you discuss any questions  you have with your health care provider. Document Released: 11/15/2007 Document Revised: 08/27/2015 Document Reviewed: 07/25/2015 Elsevier Interactive Patient Education  2019 [employer].

## 2018-06-29 NOTE — Discharge Summary (Signed)
Physician Discharge Summary  Patient ID: Matthew Li MRN: [id number] DOB/AGE: 1975/12/05 43 y.o.  Admit date: 06/19/2018 Discharge date: 06/29/2018  Admission Diagnoses: acute appendicitis  Discharge Diagnoses:  Perforated appendicitis, and generalized peritonitis Discharged Condition: good  Hospital Course: dx with above, but noted to have frank purulent drainage and perforated appendix intraop, requiring converting from lap to open appendectomy.  See op note for details.  Continued with abx afterwards, and developed ileus, requiring NG decompression.  He also developed urinary retention requiring catherization.  Foley removed, placed on flomax shortly afterwards.  Ileus persisted despite decompression, so SBFT ordered and noted ileus, but contrast noted to be in colon the following day and slight improvement noted on exam.  NG clamped, then eventually discharged, diet advanced slowly and patient tolerated well without any issues.  Staples removed the day of discharge.  JP to remain in place to continue to monitor appendiceal stump staple line.  At time of discharge, tolerating regular diet, recorded BMs, pain controlled, JP output remains minimal serous fluid.  Consults: None  Discharge Exam: Blood pressure 99/70, pulse 97, temperature 97.8 F (36.6 C), temperature source Oral, resp. rate 18, height 5\' 3"  (1.6 m), weight 75 kg, SpO2 100 %. General appearance: alert, cooperative and no distress GI: soft, no guarding, minimal distention, JP with serous drainage, midline incision c/d/i  Disposition:  Discharge disposition: 01-Home or Self Care       Discharge Instructions    Discharge patient   Complete by:  As directed    Discharge disposition:  01-Home or Self Care   Discharge patient date:  06/29/2018     Allergies as of 06/29/2018   No Known Allergies     Medication List    STOP taking these medications   pantoprazole 40 MG tablet Commonly known as:  PROTONIX    polyethylene glycol 17 g packet Commonly known as:  MIRALAX / GLYCOLAX   sucralfate 1 g tablet Commonly known as:  Carafate     TAKE these medications   acetaminophen 325 MG tablet Commonly known as:  Tylenol Take 2 tablets (650 mg total) by mouth every 8 (eight) hours as needed for up to 30 days for mild pain.   amoxicillin-clavulanate 875-125 MG tablet Commonly known as:  Augmentin Take 1 tablet by mouth 2 (two) times daily for 7 days.   docusate sodium 100 MG capsule Commonly known as:  Colace Take 1 capsule (100 mg total) by mouth 2 (two) times daily as needed for up to 10 days for mild constipation.   HYDROcodone-acetaminophen 5-325 MG tablet Commonly known as:  Norco Take 1 tablet by mouth every 6 (six) hours as needed for up to 3 days for moderate pain.   ibuprofen 800 MG tablet Commonly known as:  ADVIL Take 1 tablet (800 mg total) by mouth every 8 (eight) hours as needed for mild pain or moderate pain.      Follow-up Information    Sung Amabile, DO Follow up on 07/02/2018.   Specialty:  Surgery Why:  wound check and JP drain removal Contact information: [address] [place] [address] [phone]            Total time spent arranging discharge was >71min. Signed: Sung Amabile 06/29/2018, 6:45 PM

## 2018-06-29 NOTE — Plan of Care (Signed)

## 2018-07-17 ENCOUNTER — Other Ambulatory Visit: Payer: Self-pay | Admitting: Surgery

## 2018-07-17 ENCOUNTER — Emergency Department
Admission: EM | Admit: 2018-07-17 | Discharge: 2018-07-17 | Disposition: A | Discharge status: Home / Self Care | Payer: BC Managed Care – PPO

## 2018-07-17 ENCOUNTER — Inpatient Hospital Stay: Admit: 2018-07-17 | Payer: BC Managed Care – PPO

## 2018-07-17 ENCOUNTER — Ambulatory Visit
Admission: RE | Admit: 2018-07-17 | Discharge: 2018-07-17 | Disposition: A | Discharge status: Home / Self Care | Payer: BC Managed Care – PPO | Source: Ambulatory Visit | Attending: Gastroenterology | Admitting: Gastroenterology

## 2018-07-17 ENCOUNTER — Other Ambulatory Visit: Payer: Self-pay | Admitting: Student

## 2018-07-17 ENCOUNTER — Inpatient Hospital Stay
Admission: AD | Admit: 2018-07-17 | Discharge: 2018-07-20 | DRG: 373 | Disposition: A | Discharge status: Home / Self Care | Payer: BC Managed Care – PPO | Source: Ambulatory Visit | Attending: Surgery | Admitting: Surgery

## 2018-07-17 ENCOUNTER — Other Ambulatory Visit: Payer: Self-pay

## 2018-07-17 DIAGNOSIS — K651 Peritoneal abscess: Secondary | ICD-10-CM | POA: Diagnosis present

## 2018-07-17 DIAGNOSIS — Z8719 Personal history of other diseases of the digestive system: Secondary | ICD-10-CM | POA: Diagnosis not present

## 2018-07-17 DIAGNOSIS — G5781 Other specified mononeuropathies of right lower limb: Secondary | ICD-10-CM | POA: Diagnosis present

## 2018-07-17 DIAGNOSIS — Z9049 Acquired absence of other specified parts of digestive tract: Secondary | ICD-10-CM | POA: Diagnosis not present

## 2018-07-17 DIAGNOSIS — Z1159 Encounter for screening for other viral diseases: Secondary | ICD-10-CM | POA: Diagnosis not present

## 2018-07-17 LAB — PROTIME-INR
INR: 1.2 (ref 0.8–1.2)
Prothrombin Time: 14.6 seconds (ref 11.4–15.2)

## 2018-07-17 LAB — SARS CORONAVIRUS 2 BY RT PCR (HOSPITAL ORDER, PERFORMED IN ~~LOC~~ HOSPITAL LAB): SARS Coronavirus 2: NEGATIVE

## 2018-07-17 MED ORDER — SODIUM CHLORIDE 0.9 % IV SOLN
INTRAVENOUS | Status: DC | PRN
  Administered 2018-07-17: 1 mL via INTRAVENOUS

## 2018-07-17 MED ORDER — ONDANSETRON 4 MG PO TBDP: 4.0000 mg | ORAL_TABLET | Freq: Four times a day (QID) | ORAL | Status: DC | PRN

## 2018-07-17 MED ORDER — DOCUSATE SODIUM 100 MG PO CAPS
100.0000 mg | ORAL_CAPSULE | Freq: Two times a day (BID) | ORAL | Status: DC | PRN
  Administered 2018-07-19 (×2): 100 mg via ORAL
  Filled 2018-07-17 (×2): qty 1

## 2018-07-17 MED ORDER — HYDROCODONE-ACETAMINOPHEN 5-325 MG PO TABS
1.0000 | ORAL_TABLET | ORAL | Status: DC | PRN
  Administered 2018-07-17 – 2018-07-19 (×3): 2 via ORAL
  Filled 2018-07-17 (×4): qty 2

## 2018-07-17 MED ORDER — MORPHINE SULFATE (PF) 2 MG/ML IV SOLN
2.0000 mg | INTRAVENOUS | Status: DC | PRN
  Administered 2018-07-17 – 2018-07-20 (×6): 2 mg via INTRAVENOUS
  Filled 2018-07-17 (×6): qty 1

## 2018-07-17 MED ORDER — LACTATED RINGERS IV SOLN
INTRAVENOUS | Status: DC
  Administered 2018-07-17 – 2018-07-19 (×4): via INTRAVENOUS

## 2018-07-17 MED ORDER — IOHEXOL 300 MG/ML  SOLN
100.0000 mL | Freq: Once | INTRAMUSCULAR | Status: AC | PRN
  Administered 2018-07-17: 100 mL via INTRAVENOUS

## 2018-07-17 MED ORDER — PIPERACILLIN-TAZOBACTAM 3.375 G IVPB
3.3750 g | Freq: Three times a day (TID) | INTRAVENOUS | Status: DC
  Administered 2018-07-17 – 2018-07-20 (×9): 3.375 g via INTRAVENOUS
  Filled 2018-07-17 (×9): qty 50

## 2018-07-17 MED ORDER — ONDANSETRON HCL 4 MG/2ML IJ SOLN: 4.0000 mg | Freq: Four times a day (QID) | INTRAMUSCULAR | Status: DC | PRN

## 2018-07-17 MED ORDER — IBUPROFEN 400 MG PO TABS: 600.0000 mg | ORAL_TABLET | Freq: Four times a day (QID) | ORAL | Status: DC | PRN

## 2018-07-17 NOTE — H&P (Signed)
Subjective:   CC: Appendectomy POSTOP  HPI:  Matthew Li is a 43 y.o. male who is here for followup from above.  Thought he was improving, but started to have increased bloating, diarrhea, and pain in pelvic region past couple weeks.  Able to tolerate some oral intake.  Reported fever of 100.3  Current Medications: has a current medication list which includes the following prescription(s): dok, ibuprofen, and amoxicillin-clavulanate.  Allergies:  No Known Allergies  ROS: General: Denies weight loss, weight gain, fatigue, fevers, chills, and night sweats. Heart: Denies chest pain, palpitations, racing heart, irregular heartbeat, leg pain or swelling, and decreased activity tolerance. Respiratory: Denies breathing difficulty, shortness of breath, wheezing, cough, and sputum. GI: Denies change in appetite, heartburn, nausea, vomiting, constipation, diarrhea, and blood in stool. GU: Denies difficulty urinating, pain with urinating, urgency, frequency, blood in urine    Objective:   BP 117/89   Pulse (!) 115   Temp (!) 37.9 C (100.3 F) (Oral)   Ht 160 cm (5\' 3" )   Wt 69.4 kg (153 lb)   BMI 27.10 kg/m   Constitutional :  alert, appears stated age, cooperative and no distress  Gastrointestinal: soft, non-tender; bowel sounds normal; no masses,  no organomegaly.  JP with serous drainage.  Musculoskeletal: Steady gait and movement  Skin: Cool and moist, incisions clean, dry, intact, but increased tenderness in pelvic region, more so at midline incision but no appreciable hernia on exam.  Psychiatric: Normal affect, non-agitated, not confused       LABS:  N/A   RADS: CLINICAL DATA:  Generalized abdominal pain.  Possible abscess.  EXAM: CT ABDOMEN AND PELVIS WITH CONTRAST  TECHNIQUE: Multidetector CT imaging of the abdomen and pelvis was performed using the standard protocol following bolus administration of intravenous contrast.  CONTRAST:  OMNIPAQUE  IOHEXOL 300 MG/ML  SOLN  COMPARISON:  CT scan of Jun 25, 2018.  FINDINGS: Lower chest: No acute abnormality.  Hepatobiliary: No focal liver abnormality is seen. No gallstones, gallbladder wall thickening, or biliary dilatation.  Pancreas: Unremarkable. No pancreatic ductal dilatation or surrounding inflammatory changes.  Spleen: Normal in size without focal abnormality.  Adrenals/Urinary Tract: Adrenal glands are unremarkable. Kidneys are normal, without renal calculi, focal lesion, or hydronephrosis. Bladder is unremarkable.  Stomach/Bowel: Stomach appears normal. Status post appendectomy. There is no evidence of bowel obstruction. 4.4 x 4.1 cm multiloculated fluid collection is seen in the pelvis in the pre rectal space most consistent with abscess. This is enlarged compared to prior exam. Significant inflammatory changes are noted. There is significant wall thickening of the adjacent rectum and sigmoid colon most consistent with secondary inflammation.  Vascular/Lymphatic: No significant vascular findings are present. No enlarged abdominal or pelvic lymph nodes.  Reproductive: Prostate is unremarkable.  Other: No abdominal wall hernia or abnormality. No abdominopelvic ascites.  Musculoskeletal: No acute or significant osseous findings.  IMPRESSION: 4.4 x 4.1 cm multiloculated fluid collection is seen in the central portion of the pelvis in the pre rectal space with significant inflammatory changes, most consistent with abscess. This appears to cause secondary inflammation of the adjacent sigmoid colon and rectum.   Electronically Signed   By: Lupita Raider M.D.   On: 07/17/2018 14:07   Assessment:     s/p open appendectomy for perforated appendicitis Acute appendicitis with perforation, generalized peritonitis, and gangrene, without abscess [K35.20]  CT results now show a pelvic abscess likely secondary to the previous appendicitis. Plan:    1.   Discussed  case with interventional radiology who is agreeable to attempt drainage of the fluid collection.  Patient will be admitted to the hospital, IV fluids, IV Zosyn, n.p.o. after midnight in preparation for the IR IR drainage.  Lovenox will be held in preparation for the surgery.    Electronically signed by Sung AmabileSakai, Matthew Bob, DO on 07/17/2018 11:54 AM

## 2018-07-17 NOTE — Consult Note (Signed)
Pharmacy Antibiotic Note  Matthew Li is a 43 y.o. male admitted on 07/17/2018 with appendicitis.  Pharmacy has been consulted for Zosyn dosing.  Plan: Zosyn 3.375g IV q8h (4 hour infusion).  Height: 5\' 4"  (162.6 cm) Weight: 154 lb 15.7 oz (70.3 kg) IBW/kg (Calculated) : 59.2  Temp (24hrs), Avg:100.7 F (38.2 C), Min:100.7 F (38.2 C), Max:100.7 F (38.2 C)  No results for input(s): WBC, CREATININE, LATICACIDVEN, VANCOTROUGH, VANCOPEAK, VANCORANDOM, GENTTROUGH, GENTPEAK, GENTRANDOM, TOBRATROUGH, TOBRAPEAK, TOBRARND, AMIKACINPEAK, AMIKACINTROU, AMIKACIN in the last 168 hours.  Estimated Creatinine Clearance: 72.6 mL/min (by C-G formula based on SCr of 1.11 mg/dL).    No Known Allergies  Antimicrobials this admission: Zosyn 5/28 >>   Microbiology results: None pending or resulted  Thank you for allowing pharmacy to be a part of this patient's care.  Lowella Bandy, PharmD 07/17/2018 3:57 PM

## 2018-07-18 ENCOUNTER — Inpatient Hospital Stay: Payer: BC Managed Care – PPO

## 2018-07-18 LAB — CBC
HCT: 34 % — ABNORMAL LOW (ref 39.0–52.0)
Hemoglobin: 11.5 g/dL — ABNORMAL LOW (ref 13.0–17.0)
MCH: 29.6 pg (ref 26.0–34.0)
MCHC: 33.8 g/dL (ref 30.0–36.0)
MCV: 87.6 fL (ref 80.0–100.0)
Platelets: 195 10*3/uL (ref 150–400)
RBC: 3.88 MIL/uL — ABNORMAL LOW (ref 4.22–5.81)
RDW: 12.8 % (ref 11.5–15.5)
WBC: 10.9 10*3/uL — ABNORMAL HIGH (ref 4.0–10.5)
nRBC: 0 % (ref 0.0–0.2)

## 2018-07-18 LAB — BASIC METABOLIC PANEL WITH GFR
Anion gap: 10 (ref 5–15)
BUN: 14 mg/dL (ref 6–20)
CO2: 26 mmol/L (ref 22–32)
Calcium: 8.6 mg/dL — ABNORMAL LOW (ref 8.9–10.3)
Chloride: 103 mmol/L (ref 98–111)
Creatinine, Ser: 1.22 mg/dL (ref 0.61–1.24)
GFR calc Af Amer: >60 mL/min
GFR calc non Af Amer: >60 mL/min
Glucose, Bld: 109 mg/dL — ABNORMAL HIGH (ref 70–99)
Potassium: 3.6 mmol/L (ref 3.5–5.1)
Sodium: 139 mmol/L (ref 135–145)

## 2018-07-18 LAB — MAGNESIUM: Magnesium: 2.1 mg/dL (ref 1.7–2.4)

## 2018-07-18 LAB — PHOSPHORUS: Phosphorus: 6.1 mg/dL — ABNORMAL HIGH (ref 2.5–4.6)

## 2018-07-18 MED ORDER — MIDAZOLAM HCL 5 MG/5ML IJ SOLN
INTRAMUSCULAR | Status: AC | PRN
  Administered 2018-07-18: 1 mg via INTRAVENOUS
  Administered 2018-07-18: 0.5 mg via INTRAVENOUS
  Administered 2018-07-18: 1 mg via INTRAVENOUS
  Administered 2018-07-18 (×3): 0.5 mg via INTRAVENOUS

## 2018-07-18 MED ORDER — FENTANYL CITRATE (PF) 100 MCG/2ML IJ SOLN
INTRAMUSCULAR | Status: AC
  Filled 2018-07-18: qty 4

## 2018-07-18 MED ORDER — NEPRO/CARBSTEADY PO LIQD
237.0000 mL | Freq: Two times a day (BID) | ORAL | Status: DC
  Administered 2018-07-19 – 2018-07-20 (×2): 237 mL via ORAL

## 2018-07-18 MED ORDER — FENTANYL CITRATE (PF) 100 MCG/2ML IJ SOLN
INTRAMUSCULAR | Status: AC | PRN
  Administered 2018-07-18: 50 ug via INTRAVENOUS
  Administered 2018-07-18 (×5): 25 ug via INTRAVENOUS

## 2018-07-18 MED ORDER — MIDAZOLAM HCL 5 MG/5ML IJ SOLN
INTRAMUSCULAR | Status: AC
  Filled 2018-07-18: qty 5

## 2018-07-18 MED ORDER — ADULT MULTIVITAMIN W/MINERALS CH
1.0000 | ORAL_TABLET | Freq: Every day | ORAL | Status: DC
  Administered 2018-07-19 – 2018-07-20 (×2): 1 via ORAL
  Filled 2018-07-18 (×2): qty 1

## 2018-07-18 MED ORDER — SODIUM CHLORIDE 0.9% FLUSH: 5.0000 mL | Freq: Three times a day (TID) | INTRAVENOUS | Status: DC

## 2018-07-18 NOTE — Progress Notes (Signed)
Initial Nutrition Assessment  DOCUMENTATION CODES:   Not applicable  INTERVENTION:   Recommend Nepro Shake po BID, each supplement provides 425 kcal and 19 grams protein  Recommend MVI daily   NUTRITION DIAGNOSIS:   Increased nutrient needs related to post-op healing as evidenced by increased estimated needs.  GOAL:   Patient will meet greater than or equal to 90% of their needs  MONITOR:   PO intake, Supplement acceptance, Labs, Weight trends, I & O's, Skin  REASON FOR ASSESSMENT:   Malnutrition Screening Tool    ASSESSMENT:    43 y/o male admitted with pelvic abscess s/p open appendectomy for perforated appendicitis 5/1  RD working remotely.  RD familiar with this patient from recent previous admit ~ 1 month ago. Pt eating 100% of meals at time of last discharge. Per chart, pt with 11lb(7%) over the past month which is significant. Suspect pt with poor appetite and oral intake pta. Recommend supplements and MVI to support wound healing. Will start with Nepro as pt with elevated phosphorus, can switch to Ensure once P wnl. Plan of for possible IR drain per MD note.   Medications reviewed and include:   Labs reviewed: K 3.6 wnl, P 6.1(H), Mg 2.1 wnl  Unable to complete Nutrition-Focused physical exam at this time.   Diet Order:   Diet Order            Diet NPO time specified  Diet effective midnight             EDUCATION NEEDS:   No education needs have been identified at this time  Skin:  Skin Assessment: Reviewed RN Assessment(closed incision abdomen )  Last BM:  5/27  Height:   Ht Readings from Last 1 Encounters:  07/17/18 5\' 4"  (1.626 m)    Weight:   Wt Readings from Last 1 Encounters:  07/17/18 70.3 kg    Ideal Body Weight:  59 kg  BMI:  Body mass index is 26.6 kg/m.  Estimated Nutritional Needs:   Kcal:  1800-2100kcal/day   Protein:  90-105g/day   Fluid:  >1.7L/day   Betsey Holiday MS, RD, LDN Pager #- [phone] Office#-  [phone] After Hours Pager: [phone]

## 2018-07-18 NOTE — Consult Note (Signed)
Chief Complaint: Patient was seen in consultation today for No chief complaint on file.  at the request of Tahiliani,Varnita B  Referring Physician(s): Tahiliani,Varnita B  Supervising Physician: Jolaine Click  Patient Status: [facility] - In-pt  History of Present Illness: Matthew Li is a 43 y.o. male who is status post laparoscopic appendectomy 1 month ago for ruptured appendicitis.  He returned yesterday with fever and was found to have a pelvic abscess.  He is referred for pelvic abscess drainage.  History reviewed. No pertinent past medical history.  Past Surgical History:  Procedure Laterality Date   APPENDECTOMY N/A 06/20/2018   Procedure: APPENDECTOMY;  Surgeon: Sung Amabile, DO;  Location: [facility] ORS;  Service: General;  Laterality: N/A;   LAPAROSCOPIC APPENDECTOMY N/A 06/20/2018   Procedure: APPENDECTOMY LAPAROSCOPIC;  Surgeon: Sung Amabile, DO;  Location: [facility] ORS;  Service: General;  Laterality: N/A;    Allergies: Patient has no known allergies.  Medications: Prior to Admission medications   Medication Sig Start Date End Date Taking? Authorizing Provider  acetaminophen (TYLENOL) 325 MG tablet Take 2 tablets (650 mg total) by mouth every 8 (eight) hours as needed for up to 30 days for mild pain. 06/29/18 07/29/18  Tonna Boehringer, Isami, DO  ibuprofen (ADVIL) 800 MG tablet Take 1 tablet (800 mg total) by mouth every 8 (eight) hours as needed for mild pain or moderate pain. 06/29/18   Sung Amabile, DO     History reviewed. No pertinent family history.  Social History   Socioeconomic History   Marital status: Single    Spouse name: Not on file   Number of children: Not on file   Years of education: Not on file   Highest education level: Not on file  Occupational History   Not on file  Social Needs   Financial resource strain: Not hard at all   Food insecurity:    Worry: Never true    Inability: Never true   Transportation needs:    Medical: No    Non-medical:  No  Tobacco Use   Smoking status: Never Smoker   Smokeless tobacco: Never Used  Substance and Sexual Activity   Alcohol use: Yes   Drug use: Never   Sexual activity: Not on file  Lifestyle   Physical activity:    Days per week: 7 days    Minutes per session: 30 min   Stress: Not at all  Relationships   Social connections:    Talks on phone: More than three times a week    Gets together: More than three times a week    Attends religious service: Patient refused    Active member of club or organization: Patient refused    Attends meetings of clubs or organizations: Patient refused    Relationship status: Patient refused  Other Topics Concern   Not on file  Social History Narrative   Not on file     Review of Systems: A 12 point ROS discussed and pertinent positives are indicated in the HPI above.  All other systems are negative.  Review of Systems  Positive abdominal pain.  Positive chills.  No chest pain, nausea, vomiting.  Vital Signs: BP 115/79    Pulse (!) 102    Temp 98.7 F (37.1 C) (Oral)    Resp 17    Ht 5\' 4"  (1.626 m)    Wt 70.3 kg    SpO2 100%    BMI 26.60 kg/m   Physical Exam Constitutional:  Appearance: Normal appearance.  HENT:     Head: Normocephalic and atraumatic.  Cardiovascular:     Rate and Rhythm: Normal rate and regular rhythm.     Pulses: Normal pulses.     Heart sounds: Normal heart sounds.  Abdominal:     Tenderness: There is abdominal tenderness.  Neurological:     General: No focal deficit present.     Mental Status: He is alert and oriented to person, place, and time.     Imaging: Dg Abd 1 View  Result Date: 06/21/2018 CLINICAL DATA:  NG tube placement EXAM: ABDOMEN - 1 VIEW COMPARISON:  None. FINDINGS: NG tube is in the stomach. Dilated small bowel loops in the visualized left abdomen. No free air. IMPRESSION: NG tube tip in the stomach. Dilated left abdominal small bowel loops. Electronically Signed   By: Charlett NoseKevin  Dover  M.D.   On: 06/21/2018 02:56   Ct Abdomen Pelvis W Contrast  Result Date: 07/17/2018 CLINICAL DATA:  Generalized abdominal pain.  Possible abscess. EXAM: CT ABDOMEN AND PELVIS WITH CONTRAST TECHNIQUE: Multidetector CT imaging of the abdomen and pelvis was performed using the standard protocol following bolus administration of intravenous contrast. CONTRAST:  100mL OMNIPAQUE IOHEXOL 300 MG/ML  SOLN COMPARISON:  CT scan of Jun 25, 2018. FINDINGS: Lower chest: No acute abnormality. Hepatobiliary: No focal liver abnormality is seen. No gallstones, gallbladder wall thickening, or biliary dilatation. Pancreas: Unremarkable. No pancreatic ductal dilatation or surrounding inflammatory changes. Spleen: Normal in size without focal abnormality. Adrenals/Urinary Tract: Adrenal glands are unremarkable. Kidneys are normal, without renal calculi, focal lesion, or hydronephrosis. Bladder is unremarkable. Stomach/Bowel: Stomach appears normal. Status post appendectomy. There is no evidence of bowel obstruction. 4.4 x 4.1 cm multiloculated fluid collection is seen in the pelvis in the pre rectal space most consistent with abscess. This is enlarged compared to prior exam. Significant inflammatory changes are noted. There is significant wall thickening of the adjacent rectum and sigmoid colon most consistent with secondary inflammation. Vascular/Lymphatic: No significant vascular findings are present. No enlarged abdominal or pelvic lymph nodes. Reproductive: Prostate is unremarkable. Other: No abdominal wall hernia or abnormality. No abdominopelvic ascites. Musculoskeletal: No acute or significant osseous findings. IMPRESSION: 4.4 x 4.1 cm multiloculated fluid collection is seen in the central portion of the pelvis in the pre rectal space with significant inflammatory changes, most consistent with abscess. This appears to cause secondary inflammation of the adjacent sigmoid colon and rectum. Electronically Signed   By: Lupita RaiderJames  Green  Jr M.D.   On: 07/17/2018 14:07   Ct Abdomen Pelvis W Contrast  Result Date: 06/25/2018 CLINICAL DATA:  Status post appendectomy with abdominal distention and pain. Evaluate for abscess. EXAM: CT ABDOMEN AND PELVIS WITH CONTRAST TECHNIQUE: Multidetector CT imaging of the abdomen and pelvis was performed using the standard protocol following bolus administration of intravenous contrast. CONTRAST:  100mL OMNIPAQUE IOHEXOL 300 MG/ML  SOLN COMPARISON:  06/19/2018 FINDINGS: Lower chest: Small bilateral pleural effusions associated with bibasilar atelectasis. Hepatobiliary: No suspicious focal abnormality within the liver parenchyma. There is no evidence for gallstones, gallbladder wall thickening, or pericholecystic fluid. No intrahepatic or extrahepatic biliary dilation. Pancreas: No focal mass lesion. No dilatation of the main duct. No intraparenchymal cyst. No peripancreatic edema. Spleen: No splenomegaly. No focal mass lesion. Adrenals/Urinary Tract: No adrenal nodule or mass. Kidneys unremarkable. No evidence for hydroureter. Gas in the urinary bladder is presumably from recent instrumentation. Stomach/Bowel: NG tube tip is in the distal stomach. Stomach otherwise unremarkable. Duodenum is normally  positioned as is the ligament of Treitz. Mid small bowel loops are dilated up to 4.7 cm and opacified with oral contrast. Contrast is visible in decompressed bowel loops measuring only 8 mm in diameter. Distal ileal loops are decompressed and unopacified. 8 x 19 mm rim enhancing collection is appendectomy bed with high attenuation material adjacent to the cecal tip, likely related to surgery. Colon is diffusely decompressed proximally. Vascular/Lymphatic: No abdominal aortic aneurysm. No abdominal aortic atherosclerotic calcification. There is no gastrohepatic or hepatoduodenal ligament lymphadenopathy. No intraperitoneal or retroperitoneal lymphadenopathy. No pelvic sidewall lymphadenopathy. Reproductive: The prostate  gland and seminal vesicles are unremarkable. Other: 3.2 x 3.2 cm rim enhancing fluid collection is identified in the central pelvis, in the rectovesical pouch period surgical drain enters the right abdomen and tracks to the right lower quadrant with the tip positioned near the midline anterior pelvis. Musculoskeletal: No worrisome lytic or sclerotic osseous abnormality. Gas bubbles in the subcutaneous fat of the left anterior abdominal wall presumably from injection site. IMPRESSION: 1. Dilation of mid small bowel up to 4.7 cm diameter. Proximal and distal small bowel loops are nondilated. Oral contrast material has migrated into the dilated loops and has passed into more distal nondilated small bowel although not as far as the distal ileum/colon. No discrete transition zone evident. 2. Tiny rim enhancing fluid collection in the appendectomy bed is associated with a 3 cm rim enhancing fluid collection in the central pelvis, suggesting abscess. 3. Gas in the urinary bladder presumably from recent instrumentation. Electronically Signed   By: Kennith Center M.D.   On: 06/25/2018 13:48   Ct Abdomen Pelvis W Contrast  Result Date: 06/19/2018 CLINICAL DATA:  43 year old male with right abdominal pain since yesterday. EXAM: CT ABDOMEN AND PELVIS WITH CONTRAST TECHNIQUE: Multidetector CT imaging of the abdomen and pelvis was performed using the standard protocol following bolus administration of intravenous contrast. CONTRAST:  OMNIPAQUE IOHEXOL 300 MG/ML  SOLN COMPARISON:  None. FINDINGS: Lower chest: Mild respiratory motion, otherwise negative. Hepatobiliary: Hepatic steatosis. Negative gallbladder. No bile duct enlargement. Pancreas: Negative. Spleen: Negative. Adrenals/Urinary Tract: Normal adrenal glands. Normal bilateral renal enhancement and contrast excretion. Retroperitoneal stranding along the course of the ureters at the pelvic inlet, more so the right. Diminutive and unremarkable urinary bladder.  Stomach/Bowel: Decompressed rectosigmoid colon with regional fluid and inflammatory stranding secondary to the appendix. Similar decompressed distal transverse and descending colon. Gas and liquid stool in the proximal transverse and the right colon. Dilated and inflamed appendix courses into the midline and across into the left abdomen from the cecum. There is a 10 millimeter round appendicoliths at the origin (series 2 images 56 and 58 and coronal images 49 through 46). Confluent regional inflammatory stranding. No extraluminal gas. Regional free fluid including tracking into the distal small bowel mesentery on series 2, image 52. Secondary inflammation of the terminal ileum and other regional small bowel loops. Regional reactive appearing lymphadenopathy in the mesentery. Appendix: Location: Tracks across midline to the left posterior to small bowel in the upper abdomen. Diameter: 15 millimeters Appendicolith: Positive, 10 millimeters Mucosal hyper-enhancement: Positive Extraluminal gas: Negative Periappendiceal collection: Phlegmon and free fluid, no organized collection. There are mildly dilated fluid-filled small bowel loops in the left abdomen. These have a gradual transition. Largely decompressed and negative stomach. No free air identified. Vascular/Lymphatic: Major arterial structures are patent and normal. Portal venous system is patent. No other abnormal lymph nodes Reproductive: Negative. Other: Small to moderate volume of pelvic free fluid with simple  fluid density (series 2, image 74). Musculoskeletal: Negative. IMPRESSION: 1. Positive for Acute Appendicitis. Severely inflamed appendix with 10 mm appendicolith. Confluent regional inflammation. Moderate secondary inflammation of distal small bowel and upstream small bowel ileus. Free fluid but no perforation suspected. No organized fluid collection or abscess. 2. Hepatic steatosis. Electronically Signed   By: Odessa Fleming M.D.   On: 06/19/2018 22:49   Dg  Abd 2 Views  Result Date: 06/26/2018 CLINICAL DATA:  Ileus EXAM: ABDOMEN - 2 VIEW COMPARISON:  CT 06/25/2018, radiograph 06/21/2018 FINDINGS: Atelectasis at the left base. Esophageal tube tip overlies the mid stomach. No free air beneath the diaphragm. Surgical drain in the right hemiabdomen. Persistent dilatation of small bowel in the central abdomen measuring up to 5.6 cm, slight increased caliber of the bowel but similar configuration as compared with scout image from CT. Enteral contrast has migrated into the colon. IMPRESSION: 1. Slight increased dilatation of gaseous enlarged central small bowel now measuring up to 5.6 cm. Contrast has migrated into the colon. Electronically Signed   By: Jasmine Pang M.D.   On: 06/26/2018 17:17    Labs:  CBC: Recent Labs    06/26/18 0309 06/27/18 0520 06/28/18 0452 07/18/18 0313  WBC 8.8 11.2* 9.7 10.9*  HGB 13.5 13.5 13.6 11.5*  HCT 39.2 38.9* 39.9 34.0*  PLT 270 325 323 195    COAGS: Recent Labs    07/17/18 1625  INR 1.2    BMP: Recent Labs    06/26/18 0309 06/27/18 0520 06/28/18 0452 07/18/18 0313  NA 136 137 138 139  K 3.9 4.0 4.2 3.6  CL 102 104 106 103  CO2 GLUCOSE 109* 108* 113* 109*  BUN CALCIUM 8.9 8.8* 8.8* 8.6*  CREATININE 1.05 1.08 1.11 1.22  GFRNONAA >60 >60 >60 >60  GFRAA >60 >60 >60 >60    LIVER FUNCTION TESTS: Recent Labs    06/18/18 1528 06/19/18 2231  BILITOT 1.0 3.4*  AST 36 25  ALT 45* 33  ALKPHOS 49 56  PROT 7.6 8.6*  ALBUMIN 4.3 4.4    TUMOR MARKERS: No results for input(s): AFPTM, CEA, CA199, CHROMGRNA in the last 8760 hours.  Assessment and Plan:  Pelvic abscess.  CT-guided drainage to follow.  Thank you for this interesting consult.  I greatly enjoyed meeting Jancarlo Biermann and look forward to participating in their care.  A copy of this report was sent to the requesting provider on this date.  Electronically Signed: Art A Renley Gutman, MD 07/18/2018, 1:54  PM   I spent a total of 40 Minutes    in face to face in clinical consultation, greater than 50% of which was counseling/coordinating care for pelvic abscess drainage.

## 2018-07-18 NOTE — Progress Notes (Signed)
Subjective:  CC: Matthew Li is a 43 y.o. male  Hospital stay day 1,   Pelvic abscess after open appendectomy for perforated appendicitis  HPI: Drain placed today.  However, developed increasing right lower extremity pain afterwards, therefore drain removed, with improving pain shortly afterwards.  ROS:  General: Denies weight loss, weight gain, fatigue, fevers, chills, and night sweats. Heart: Denies chest pain, palpitations, racing heart, irregular heartbeat, leg pain or swelling, and decreased activity tolerance. Respiratory: Denies breathing difficulty, shortness of breath, wheezing, cough, and sputum. GI: Denies change in appetite, heartburn, nausea, vomiting, constipation, diarrhea, and blood in stool. GU: Denies difficulty urinating, pain with urinating, urgency, frequency, blood in urine.   Objective:   Temp:  [98.7 F (37.1 C)-99.1 F (37.3 C)] 98.7 F (37.1 C) (05/29 1256) Pulse Rate:  [95-106] 101 (05/29 1422) Resp:  [10-19] 10 (05/29 1422) BP: (102-120)/(71-89) 118/83 (05/29 1422) SpO2:  [99 %-100 %] 99 % (05/29 1422)     Height: 5\' 4"  (162.6 cm) Weight: 70.3 kg BMI (Calculated): 26.59   Intake/Output this shift:   Intake/Output Summary (Last 24 hours) at 07/18/2018 1539 Last data filed at 07/18/2018 1301 Gross per 24 hour  Intake 814.81 ml  Output 1050 ml  Net -235.19 ml    Constitutional :  alert, cooperative, appears stated age and no distress  Respiratory:  clear to auscultation bilaterally  Cardiovascular:  regular rate and rhythm  Gastrointestinal: soft, no guarding, but still has bilateral pelvic tendernss, slight improved since previous exam.   Skin: Cool and moist.   Psychiatric: Normal affect, non-agitated, not confused       LABS:  CMP Latest Ref Rng & Units 07/18/2018 06/28/2018 06/27/2018  Glucose 70 - 99 mg/dL 366(Y) 403(K) 742(V)  BUN 6 - 20 mg/dL 14 11 11   Creatinine 0.61 - 1.24 mg/dL 9.56 3.87 5.64  Sodium 135 - 145 mmol/L 139 138 137   Potassium 3.5 - 5.1 mmol/L 3.6 4.2 4.0  Chloride 98 - 111 mmol/L 103 106 104  CO2 22 - 32 mmol/L 26 24 22   Calcium 8.9 - 10.3 mg/dL 3.3(I) 9.5(J) 8.8(C)  Total Protein 6.5 - 8.1 g/dL - - -  Total Bilirubin 0.3 - 1.2 mg/dL - - -  Alkaline Phos 38 - 126 U/L - - -  AST 15 - 41 U/L - - -  ALT 0 - 44 U/L - - -   CBC Latest Ref Rng & Units 07/18/2018 06/28/2018 06/27/2018  WBC 4.0 - 10.5 K/uL 10.9(H) 9.7 11.2(H)  Hemoglobin 13.0 - 17.0 g/dL 11.5(L) 13.6 13.5  Hematocrit 39.0 - 52.0 % 34.0(L) 39.9 38.9(L)  Platelets 150 - 400 K/uL 195 323 325    RADS: n/a Assessment:   Pelvic abscess after open appendectomy for perforated appendicitis Status post IR guided drainage of pelvic abscess fluid.  We will continue to monitor on the floor to see if abdominal pain and diarrhea resolves.  Continue IV antibiotics throughout hospital stay, we will need oral antibiotics to complete antibiotic course as outpatient.  Plan is to discharge once abdominal pain and diarrhea resolves.

## 2018-07-18 NOTE — Consult Note (Signed)
Pharmacy Antibiotic Note  Matthew Li is a 43 y.o. male admitted on 07/17/2018 with appendicitis.  Pharmacy has been consulted for Zosyn dosing. Patient now s/p open appendectomy for perforated appendicitis: next IR-guided drainage later today. His renal function is only slightly worse than his baseline  Plan: Continue Zosyn 3.375g IV q8h (4 hour infusion).  Height: 5\' 4"  (162.6 cm) Weight: 154 lb 15.7 oz (70.3 kg) IBW/kg (Calculated) : 59.2  Temp (24hrs), Avg:99.4 F (37.4 C), Min:98.9 F (37.2 C), Max:100.7 F (38.2 C)  Recent Labs  Lab 07/18/18 0313  WBC 10.9*  CREATININE 1.22    Estimated Creatinine Clearance: 66 mL/min (by C-G formula based on SCr of 1.22 mg/dL).    No Known Allergies  Antimicrobials this admission: Zosyn 5/28 >>   Microbiology results: 5/28 SARS CoV-2 negative  Thank you for allowing pharmacy to be a part of this patient's care.  Lowella Bandy, PharmD 07/18/2018 12:43 PM

## 2018-07-18 NOTE — Progress Notes (Signed)
Patient ID: Matthew Li, male   DOB: [birth year], 43 y.o.   MRN: [id number]   After arriving to the floor, 1 hour after the procedure, the patient developed right lower extremity pain.  This was associated with pelvic pain.  He also described inability to move his leg.  Upon arrival to the room, the patient was noted to be neurologically intact in the right lower extremity.  He was moving his leg freely with somewhat diminished strength upon dorsiflexion and plantar flexion of his foot.  Leg flexion and extension was intact.  He denied any anesthesia.  The drain bulb contained 25 cc of pus.  The drain was then cut and removed without complication or difficulty.  The patient described immediate improvement in the pain within his right lower extremity.  More than likely, the drain impinged the pelvic nerve bundle after the patient was placed supine in his bed.

## 2018-07-18 NOTE — Procedures (Signed)
R Transgluteal pelvic 10 Fr Abscess Drain 30 cc pus EBL 0 Comp 0

## 2018-07-18 NOTE — Progress Notes (Signed)
   07/18/18 1000  Clinical Encounter Type  Visited With Patient  Visit Type Initial  Referral From Nurse  Spiritual Encounters  Spiritual Needs Emotional  Stress Factors  Patient Stress Factors Health changes;Lack of knowledge  Pt sought out for emotional support to ch. Pt is frustrated with having to come back to the hospital for the same problems. This experience of rehospitalization seems to have contributed to the loss of trust and confidence in the medical care that he receives. Pt values medical team's honesty in sharing information. Pt has good family/friend support, yet feels tired of their attention and having to attend to their emotional needs. Pt also is developing some fear for death as he encounters prolonged medical problems. Ch made space for the pt to openly talk about his frustration and fear and recommended distraction techniques to give his mind a break from worries.

## 2018-07-19 LAB — CBC
HCT: 31.7 % — ABNORMAL LOW (ref 39.0–52.0)
Hemoglobin: 10.7 g/dL — ABNORMAL LOW (ref 13.0–17.0)
MCH: 29.2 pg (ref 26.0–34.0)
MCHC: 33.8 g/dL (ref 30.0–36.0)
MCV: 86.6 fL (ref 80.0–100.0)
Platelets: 182 10*3/uL (ref 150–400)
RBC: 3.66 MIL/uL — ABNORMAL LOW (ref 4.22–5.81)
RDW: 12.8 % (ref 11.5–15.5)
WBC: 7.6 10*3/uL (ref 4.0–10.5)
nRBC: 0 % (ref 0.0–0.2)

## 2018-07-19 LAB — PHOSPHORUS: Phosphorus: 3.7 mg/dL (ref 2.5–4.6)

## 2018-07-19 LAB — BASIC METABOLIC PANEL
Anion gap: 9 (ref 5–15)
BUN: 12 mg/dL (ref 6–20)
CO2: 28 mmol/L (ref 22–32)
Calcium: 8.7 mg/dL — ABNORMAL LOW (ref 8.9–10.3)
Chloride: 104 mmol/L (ref 98–111)
Creatinine, Ser: 1.05 mg/dL (ref 0.61–1.24)
GFR calc Af Amer: >60 mL/min (ref >60)
GFR calc non Af Amer: >60 mL/min (ref >60)
Glucose, Bld: 120 mg/dL — ABNORMAL HIGH (ref 70–99)
Potassium: 3.6 mmol/L (ref 3.5–5.1)
Sodium: 141 mmol/L (ref 135–145)

## 2018-07-19 LAB — MAGNESIUM: Magnesium: 2.2 mg/dL (ref 1.7–2.4)

## 2018-07-19 NOTE — Plan of Care (Signed)
  Problem: Health Behavior/Discharge Planning: Goal: Ability to manage health-related needs will improve Outcome: Progressing   Problem: Clinical Measurements: Goal: Ability to maintain clinical measurements within normal limits will improve Outcome: Progressing Goal: Will remain free from infection Outcome: Progressing Goal: Diagnostic test results will improve Outcome: Progressing   Problem: Nutrition: Goal: Adequate nutrition will be maintained Outcome: Progressing   Problem: Coping: Goal: Level of anxiety will decrease Outcome: Progressing   Problem: Elimination: Goal: Will not experience complications related to bowel motility Outcome: Progressing   Problem: Pain Managment: Goal: General experience of comfort will improve Outcome: Progressing   Problem: Safety: Goal: Ability to remain free from injury will improve Outcome: Progressing   Problem: Skin Integrity: Goal: Risk for impaired skin integrity will decrease Outcome: Progressing   

## 2018-07-19 NOTE — Progress Notes (Signed)
This is a patient of Dr. Geoffery Li, seen today on rounds.  He is a 43 year old man who underwent open appendectomy for perforated appendicitis.  He came to the hospital yesterday due to worsening abdominal pain and diarrhea.  A CT scan revealed a pelvic abscess.  A drain was placed with removal of approximately 50 cc of pus, however once he was turned into the supine position, he developed significant leg pain and the drain was removed, with the idea that perhaps there was nerve impingement secondary to the tubing.  Today, he states that his pain has markedly improved.  He has not had any nausea or vomiting.  He has not had a bowel movement, he is tolerating a regular diet.  He is a little bit concerned about being discharged, as he does not want to have to return yet again.  He is currently on Zosyn. WBC down today.  History reviewed. No pertinent past medical history. Past Surgical History:  Procedure Laterality Date  . APPENDECTOMY N/A 06/20/2018   Procedure: APPENDECTOMY;  Surgeon: Sung Amabile, DO;  Location: [facility] ORS;  Service: General;  Laterality: N/A;  . LAPAROSCOPIC APPENDECTOMY N/A 06/20/2018   Procedure: APPENDECTOMY LAPAROSCOPIC;  Surgeon: Sung Amabile, DO;  Location: [facility] ORS;  Service: General;  Laterality: N/A;   History reviewed. No pertinent family history. Social History   Tobacco Use  . Smoking status: Never Smoker  . Smokeless tobacco: Never Used  Substance Use Topics  . Alcohol use: Yes  . Drug use: Never    Today's Vitals   07/18/18 1959 07/18/18 2100 07/19/18 0411 07/19/18 0845  BP:  119/75 97/67   Pulse:  (!) 102 84   Resp:  18 18   Temp:  98.3 F (36.8 C) 98.5 F (36.9 C)   TempSrc:  Oral Oral   SpO2:  98% 99%   Weight:      Height:      PainSc: 8    0-No pain   Body mass index is 26.6 kg/m.  Physical Exam  Constitutional: He is oriented to person, place, and time. He appears well-developed and well-nourished. No distress.  Eyes: Right eye exhibits no  discharge. Left eye exhibits no discharge.  Cardiovascular: Normal rate and regular rhythm.  Pulmonary/Chest: Effort normal.  Abdominal: Soft.  Minimally tender.  Surgical incision healing well without evidence of infection.  Musculoskeletal:        General: No edema.  Neurological: He is alert and oriented to person, place, and time.  Skin: Skin is warm.  Psychiatric: He has a normal mood and affect.   Results for Matthew, Li (MRN [id number]) as of 07/19/2018 10:33  Ref. Range 07/19/2018 05:44  Sodium Latest Ref Range: 135 - 145 mmol/L 141  Potassium Latest Ref Range: 3.5 - 5.1 mmol/L 3.6  Chloride Latest Ref Range: 98 - 111 mmol/L 104  CO2 Latest Ref Range: 22 - 32 mmol/L 28  Glucose Latest Ref Range: 70 - 99 mg/dL 750 (H)  BUN Latest Ref Range: 6 - 20 mg/dL 12  Creatinine Latest Ref Range: 0.61 - 1.24 mg/dL 5.18  Calcium Latest Ref Range: 8.9 - 10.3 mg/dL 8.7 (L)  Anion gap Latest Ref Range: 5 - 15  9  Phosphorus Latest Ref Range: 2.5 - 4.6 mg/dL 3.7  Magnesium Latest Ref Range: 1.7 - 2.4 mg/dL 2.2  GFR, Est Non African American Latest Ref Range: >60 mL/min >60  GFR, Est African American Latest Ref Range: >60 mL/min >60  WBC Latest Ref  Range: 4.0 - 10.5 K/uL 7.6  RBC Latest Ref Range: 4.22 - 5.81 MIL/uL 3.66 (L)  Hemoglobin Latest Ref Range: 13.0 - 17.0 g/dL 96.010.7 (L)  HCT Latest Ref Range: 39.0 - 52.0 % 31.7 (L)  MCV Latest Ref Range: 80.0 - 100.0 fL 86.6  MCH Latest Ref Range: 26.0 - 34.0 pg 29.2  MCHC Latest Ref Range: 30.0 - 36.0 g/dL 45.433.8  RDW Latest Ref Range: 11.5 - 15.5 % 12.8  Platelets Latest Ref Range: 150 - 400 K/uL 182  nRBC Latest Ref Range: 0.0 - 0.2 % 0.0   Gram stain from pus shows gram-positive cocci with a small number of gram-negative rods.  Impression and plan: Matthew Li is a 43 year old man who came to the hospital with a pelvic abscess after undergoing an open appendectomy.  The pus was aspirated, however he was unable to tolerate the drain secondary  to nerve impingement type symptoms.  He is afebrile and his white count has normalized.  We will likely be able to discharge him tomorrow with a course of oral antibiotics.

## 2018-07-20 LAB — BASIC METABOLIC PANEL
Anion gap: 9 (ref 5–15)
BUN: 10 mg/dL (ref 6–20)
CO2: 29 mmol/L (ref 22–32)
Calcium: 8.6 mg/dL — ABNORMAL LOW (ref 8.9–10.3)
Chloride: 103 mmol/L (ref 98–111)
Creatinine, Ser: 0.96 mg/dL (ref 0.61–1.24)
GFR calc Af Amer: >60 mL/min (ref >60)
GFR calc non Af Amer: >60 mL/min (ref >60)
Glucose, Bld: 94 mg/dL (ref 70–99)
Potassium: 3.8 mmol/L (ref 3.5–5.1)
Sodium: 141 mmol/L (ref 135–145)

## 2018-07-20 LAB — CBC
HCT: 32.7 % — ABNORMAL LOW (ref 39.0–52.0)
Hemoglobin: 11.1 g/dL — ABNORMAL LOW (ref 13.0–17.0)
MCH: 29 pg (ref 26.0–34.0)
MCHC: 33.9 g/dL (ref 30.0–36.0)
MCV: 85.4 fL (ref 80.0–100.0)
Platelets: 207 10*3/uL (ref 150–400)
RBC: 3.83 MIL/uL — ABNORMAL LOW (ref 4.22–5.81)
RDW: 12.4 % (ref 11.5–15.5)
WBC: 7.3 10*3/uL (ref 4.0–10.5)
nRBC: 0 % (ref 0.0–0.2)

## 2018-07-20 LAB — MAGNESIUM: Magnesium: 2.1 mg/dL (ref 1.7–2.4)

## 2018-07-20 LAB — PHOSPHORUS: Phosphorus: 4.3 mg/dL (ref 2.5–4.6)

## 2018-07-20 MED ORDER — HYDROCODONE-ACETAMINOPHEN 5-325 MG PO TABS: 1.0000 | ORAL_TABLET | ORAL | 0 refills | Status: DC | PRN

## 2018-07-20 MED ORDER — IBUPROFEN 600 MG PO TABS: 600.0000 mg | ORAL_TABLET | Freq: Four times a day (QID) | ORAL | 0 refills | Status: DC | PRN

## 2018-07-20 MED ORDER — AMOXICILLIN-POT CLAVULANATE 875-125 MG PO TABS: 1.0000 | ORAL_TABLET | Freq: Two times a day (BID) | ORAL | 0 refills | Status: AC

## 2018-07-20 NOTE — Progress Notes (Signed)
Discharge instructions reviewed with the patient. Patient has some apprehension about leaving and this was relayed to Dr Apolinar Junes. Patient being sent out via wheelchair to his own car. He is driving himself home

## 2018-07-20 NOTE — Discharge Summary (Signed)
Physician Discharge Summary  Patient ID: Matthew Li MRN: [id number] DOB/AGE: [birth year] 43 y.o.  Admit date: 07/17/2018 Discharge date: 07/20/2018  Admission Diagnoses: Pelvic abscess  Discharge Diagnoses:  Active Problems:   Pelvic abscess in male [facility])   Discharged Condition: good  Hospital Course: Matthew Li came into the hospital with findings of a pelvic abscess after undergoing an open appendectomy recently.  A CT-guided drain was placed and approximately 50 cc of pus was drained.  Unfortunately, he suffered severe leg pain (thought to be secondary to nerve impingement) and the drain was removed.  He remained on IV antibiotics and his pain, both abdominal and leg, improved.  His white blood cell count normalized and he had no fevers.  He had a bowel movement and his abdomen was soft.  He was therefore felt to be stable for discharge to home.  Consults: None  Significant Diagnostic Studies: microbiology: Pus from abscess showing gram positive cocci (moderate) and rare gram-negative rods.  No final data available at the time of discharge.  Treatments: IV hydration, antibiotics: Zosyn and drainage of pus  Discharge Exam: Blood pressure 115/82, pulse 80, temperature 98.2 F (36.8 C), temperature source Oral, resp. rate 18, height 5\' 4"  (1.626 m), weight 70.3 kg, SpO2 99 %. General appearance: alert, cooperative and no distress Resp: clear to auscultation bilaterally Cardio: regular rate and rhythm GI: Abdomen is soft and nontender.  Surgical incision is well-healed.  No rebound, guarding, or other concerns for peritonitis.  Disposition: Discharge disposition: 01-Home or Self Care       Discharge Instructions    Call MD for:  difficulty breathing, headache or visual disturbances   Complete by:  As directed    Call MD for:  extreme fatigue   Complete by:  As directed    Call MD for:  hives   Complete by:  As directed    Call MD for:  persistant dizziness or  light-headedness   Complete by:  As directed    Call MD for:  persistant nausea and vomiting   Complete by:  As directed    Call MD for:  redness, tenderness, or signs of infection (pain, swelling, redness, odor or green/yellow discharge around incision site)   Complete by:  As directed    Call MD for:  severe uncontrolled pain   Complete by:  As directed    Call MD for:  temperature >100.4   Complete by:  As directed    Diet general   Complete by:  As directed    Increase activity slowly   Complete by:  As directed      Allergies as of 07/20/2018   No Known Allergies     Medication List    STOP taking these medications   acetaminophen 325 MG tablet Commonly known as:  Tylenol     TAKE these medications   amoxicillin-clavulanate 875-125 MG tablet Commonly known as:  Augmentin Take 1 tablet by mouth 2 (two) times daily for 14 days.   DOK 100 MG capsule Generic drug:  docusate sodium Take 100 mg by mouth 2 (two) times a day.   HYDROcodone-acetaminophen 5-325 MG tablet Commonly known as:  NORCO/VICODIN Take 1-2 tablets by mouth every 4 (four) hours as needed for moderate pain.   ibuprofen 600 MG tablet Commonly known as:  ADVIL Take 1 tablet (600 mg total) by mouth every 6 (six) hours as needed for mild pain. What changed:    medication strength  how much to  take  when to take this  reasons to take this      Follow-up Information    Sakai, Isami, DO. Schedule an appointment as soon as possible for a visit in 1 week(s).   Specialty:  Surgery Contact information: [address] [place] [address] [phone]           Signed: Duanne GuessJennifer Mccall Lomax 07/20/2018, 8:52 AM

## 2018-07-22 LAB — AEROBIC/ANAEROBIC CULTURE W GRAM STAIN (SURGICAL/DEEP WOUND)

## 2018-10-26 ENCOUNTER — Emergency Department
Admission: EM | Admit: 2018-10-26 | Discharge: 2018-10-26 | Disposition: A | Discharge status: Home / Self Care | Payer: BC Managed Care – PPO | Attending: Emergency Medicine | Admitting: Emergency Medicine

## 2018-10-26 ENCOUNTER — Other Ambulatory Visit: Payer: Self-pay

## 2018-10-26 DIAGNOSIS — R221 Localized swelling, mass and lump, neck: Secondary | ICD-10-CM | POA: Diagnosis present

## 2018-10-26 DIAGNOSIS — K115 Sialolithiasis: Secondary | ICD-10-CM | POA: Diagnosis not present

## 2018-10-26 MED ORDER — AMOXICILLIN 875 MG PO TABS: 875.0000 mg | ORAL_TABLET | Freq: Two times a day (BID) | ORAL | 0 refills | Status: AC

## 2018-10-26 MED ORDER — AMOXICILLIN 875 MG PO TABS: 875.0000 mg | ORAL_TABLET | Freq: Two times a day (BID) | ORAL | 0 refills | Status: DC

## 2018-10-26 NOTE — ED Provider Notes (Signed)
[facility] Emergency Department Provider Note  ____________________________________________   First MD Initiated Contact with Patient 10/26/18 1043     (approximate)  I have reviewed the triage vital signs and the nursing notes.   HISTORY  Chief Complaint Lymphadenopathy    HPI Matthew Li is a 43 y.o. male presents emergency department complaining of left-sided neck swelling.  He states he has a knot on the left side of his jawline which enlarges when he eats and drinks.  No fever or chills.  No sore throat.  No dental pain.  He denies any chest pain or shortness of breath.    History reviewed. No pertinent past medical history.  Patient Active Problem List   Diagnosis Date Noted  . Pelvic abscess in male [facility]) 07/17/2018  . Appendicitis with perforation 06/20/2018    Past Surgical History:  Procedure Laterality Date  . APPENDECTOMY N/A 06/20/2018   Procedure: APPENDECTOMY;  Surgeon: Sung AmabileSakai, Isami, DO;  Location: [facility] ORS;  Service: General;  Laterality: N/A;  . LAPAROSCOPIC APPENDECTOMY N/A 06/20/2018   Procedure: APPENDECTOMY LAPAROSCOPIC;  Surgeon: Sung AmabileSakai, Isami, DO;  Location: [facility] ORS;  Service: General;  Laterality: N/A;    Prior to Admission medications   Medication Sig Start Date End Date Taking? Authorizing Provider  amoxicillin (AMOXIL) 875 MG tablet Take 1 tablet (875 mg total) by mouth 2 (two) times daily. 10/26/18   Faythe GheeFisher, Susan W, PA-C    Allergies Patient has no known allergies.  No family history on file.  Social History Social History   Tobacco Use  . Smoking status: Never Smoker  . Smokeless tobacco: Never Used  Substance Use Topics  . Alcohol use: Yes  . Drug use: Never    Review of Systems  Constitutional: No fever/chills Eyes: No visual changes. ENT: No sore throat.  Positive for left-sided jaw/neck swelling Respiratory: Denies cough Genitourinary: Negative for dysuria. Musculoskeletal: Negative for back  pain. Skin: Negative for rash.    ____________________________________________   PHYSICAL EXAM:  VITAL SIGNS: ED Triage Vitals  Enc Vitals Group     BP 10/26/18 1034 122/87     Pulse Rate 10/26/18 1034 78     Resp 10/26/18 1034 17     Temp 10/26/18 1034 97.7 F (36.5 C)     Temp Source 10/26/18 1034 Oral     SpO2 10/26/18 1034 99 %     Weight 10/26/18 1035 150 lb (68 kg)     Height 10/26/18 1035 5\' 4"  (1.626 m)     Head Circumference --      Peak Flow --      Pain Score 10/26/18 1035 0     Pain Loc --      Pain Edu? --      Excl. in GC? --     Constitutional: Alert and oriented. Well appearing and in no acute distress. Eyes: Conjunctivae are normal.  Head: Atraumatic. Nose: No congestion/rhinnorhea. Mouth/Throat: Mucous membranes are moist.   Neck:  supple no lymphadenopathy noted, some inflammation noted at salivary gland Cardiovascular: Normal rate, regular rhythm. Heart sounds are normal Respiratory: Normal respiratory effort.  No retractions, lungs c t a  GU: deferred Musculoskeletal: FROM all extremities, warm and well perfused Neurologic:  Normal speech and language.  Skin:  Skin is warm, dry and intact. No rash noted. Psychiatric: Mood and affect are normal. Speech and behavior are normal.  ____________________________________________   LABS (all labs ordered are listed, but only abnormal results are displayed)  Labs Reviewed - No data to display ____________________________________________   ____________________________________________  RADIOLOGY    ____________________________________________   PROCEDURES  Procedure(s) performed: No  Procedures    ____________________________________________   INITIAL IMPRESSION / ASSESSMENT AND PLAN / ED COURSE  Pertinent labs & imaging results that were available during my care of the patient were reviewed by me and considered in my medical decision making (see chart for details).   Patient is a  43 year old male presents emergency department complaining of a swollen area that enlarges when he eats and drinks.  Physical exam shows small swollen area at the left salivary gland.  Remainder the exam is unremarkable  Explained findings to the patient.  He was placed on amoxicillin.  He is to drink through a straw and suck on candy to try and decrease the swelling.  He is to return emergency department worsening.  Or follow-up with ENT.    Matthew Li was evaluated in Emergency Department on 10/26/2018 for the symptoms described in the history of present illness. He was evaluated in the context of the global COVID-19 pandemic, which necessitated consideration that the patient might be at risk for infection with the SARS-CoV-2 virus that causes COVID-19. Institutional protocols and algorithms that pertain to the evaluation of patients at risk for COVID-19 are in a state of rapid change based on information released by regulatory bodies including the CDC and federal and state organizations. These policies and algorithms were followed during the patient's care in the ED.   As part of my medical decision making, I reviewed the following data within the [place] notes reviewed and incorporated, Old chart reviewed, Notes from prior ED visits and [place] Controlled Substance Database  ____________________________________________   FINAL CLINICAL IMPRESSION(S) / ED DIAGNOSES  Final diagnoses:  Salivary stone      NEW MEDICATIONS STARTED DURING THIS VISIT:  Current Discharge Medication List    START taking these medications   Details  amoxicillin (AMOXIL) 875 MG tablet Take 1 tablet (875 mg total) by mouth 2 (two) times daily. Qty: 20 tablet, Refills: 0         Note:  This document was prepared using Dragon voice recognition software and may include unintentional dictation errors.    Versie Starks, PA-C 10/26/18 1058    Vanessa [place], MD 10/27/18 2101

## 2018-10-26 NOTE — ED Triage Notes (Signed)
Pt c/o swelling and tenderness to the medical left lymph node under the jaw since yesterday. Denies fever, sore throat or dental pain.

## 2018-10-26 NOTE — Discharge Instructions (Signed)
Follow-up with your regular doctor if not better in 3 days.  Return emergency department worsening.  Take medication as prescribed. 

## 2018-10-26 NOTE — ED Notes (Signed)
See triage note  States he developed swelling to left side of neck yesterday  States he felt a "hard" area on palpation  No fever or pain

## 2021-03-02 IMAGING — CT CT ABDOMEN AND PELVIS WITH CONTRAST
2 of 5 series · 15 of 46 positions shown, 17 images · IV contrast (APPLIED)
Comparison: None.

CLINICAL DATA: 42-year-old male with right abdominal pain since
yesterday.

EXAM:
CT ABDOMEN AND PELVIS WITH CONTRAST
TECHNIQUE: Multidetector CT imaging of the abdomen and pelvis was performed
using the standard protocol following bolus administration of
intravenous contrast.
CONTRAST:  100mL OMNIPAQUE IOHEXOL 300 MG/ML  SOLN

[Series 2: routine abd/pel with · axial · 0.71mm/px · z∈[-527,-112]mm · 12 of 93 slices shown, 14 images]
[im 5/93  soft-tissue]
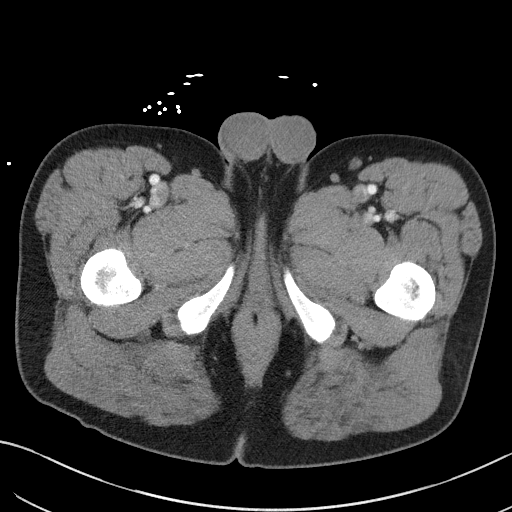
[im 5/93  bone]
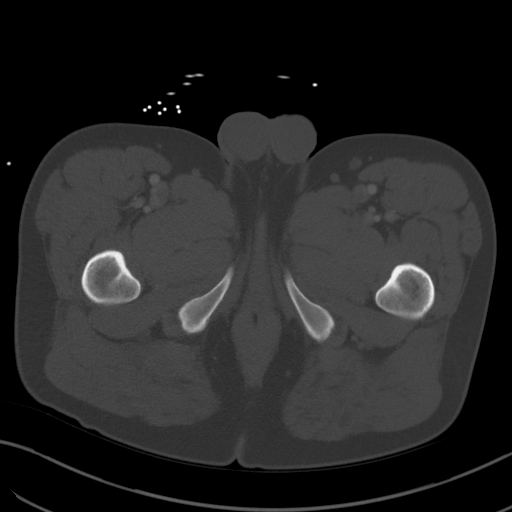
[im 14/93  soft-tissue]
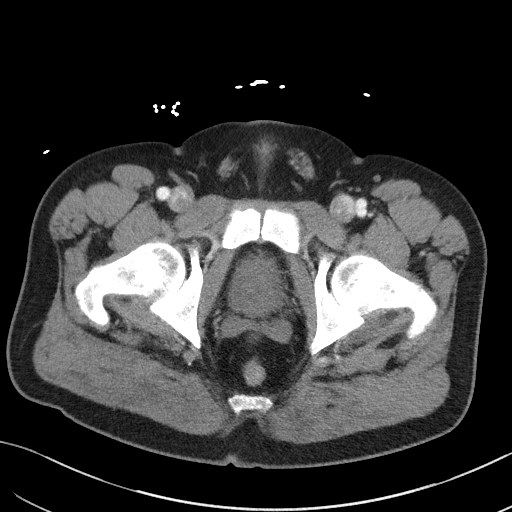
[im 19/93  soft-tissue]
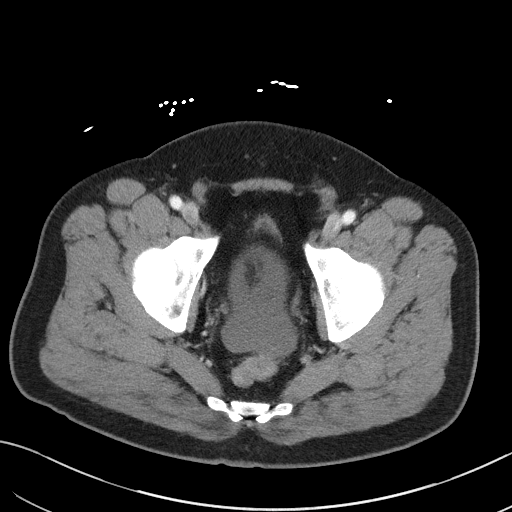
[im 28/93  soft-tissue]
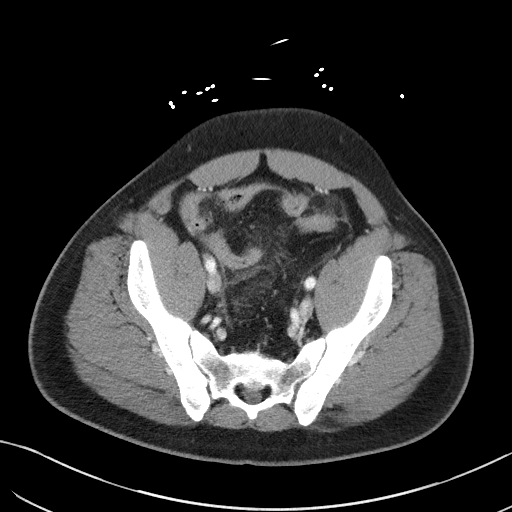
[im 37/93  soft-tissue]
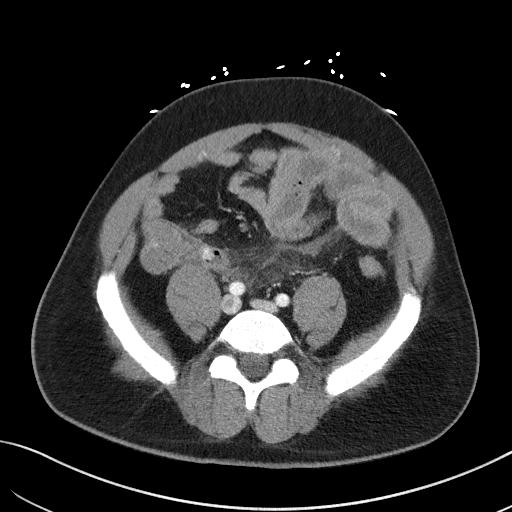
[im 42/93  soft-tissue]
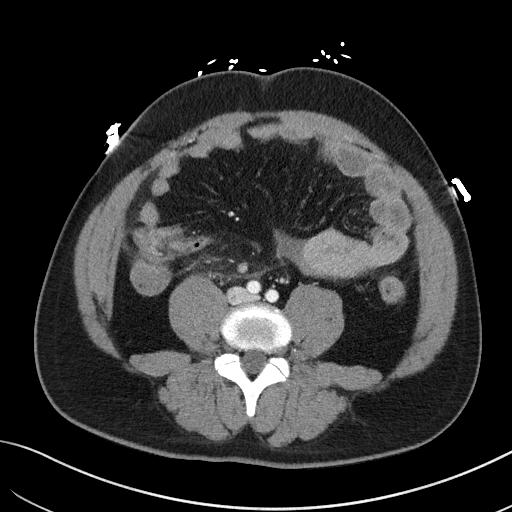
[im 51/93  soft-tissue]
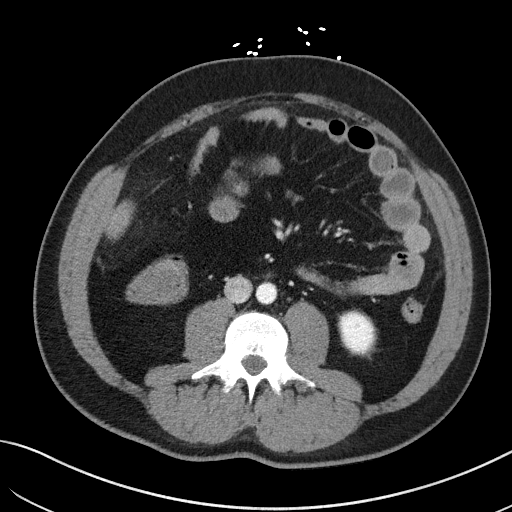
[im 56/93  soft-tissue]
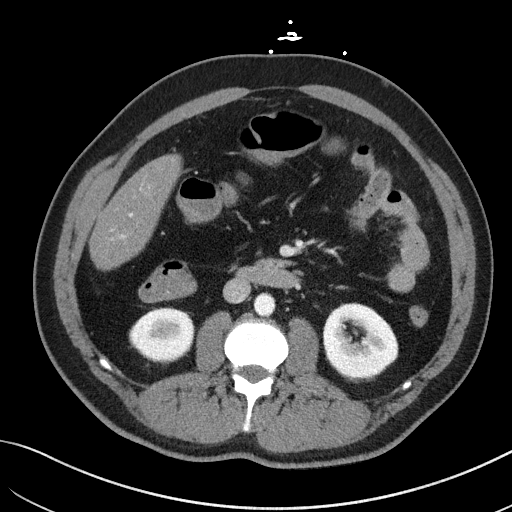
[im 65/93  soft-tissue]
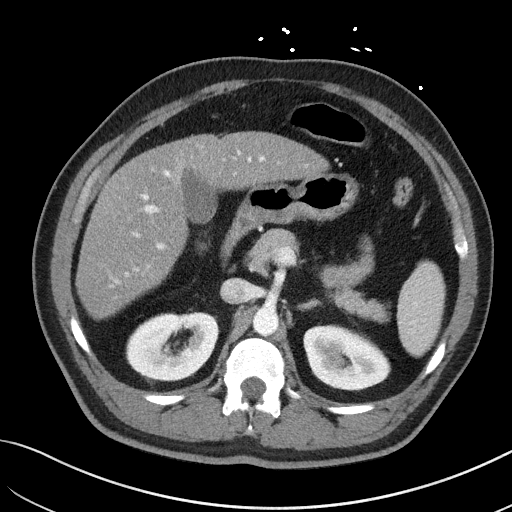
[im 65/93  bone]
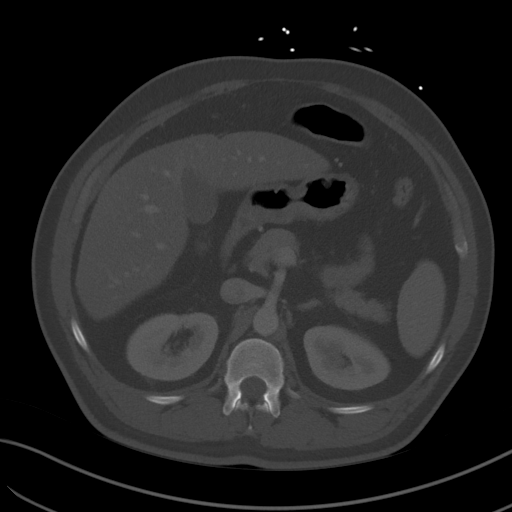
[im 74/93  soft-tissue]
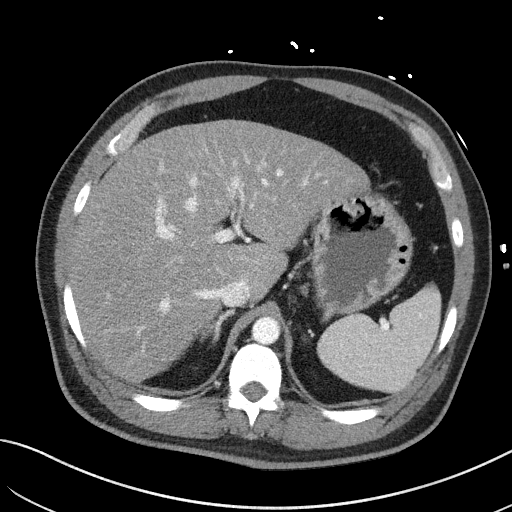
[im 79/93  soft-tissue]
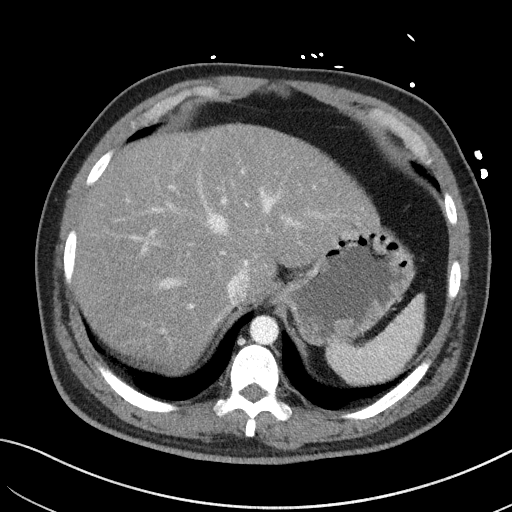
[im 88/93  soft-tissue]
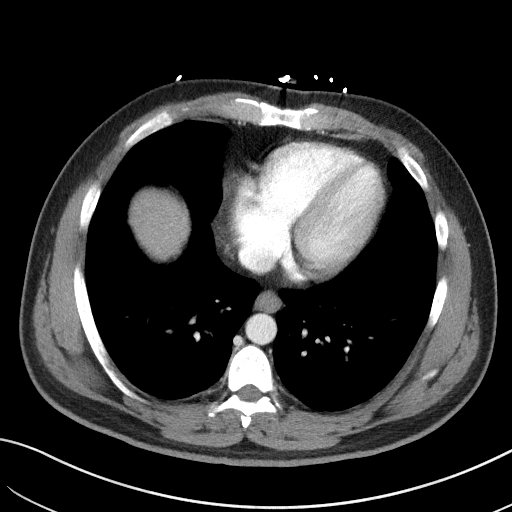

[Series 5: coronal st · coronal · 0.71mm/px · 3 of 97 slices shown]
[im 33/97  soft-tissue]
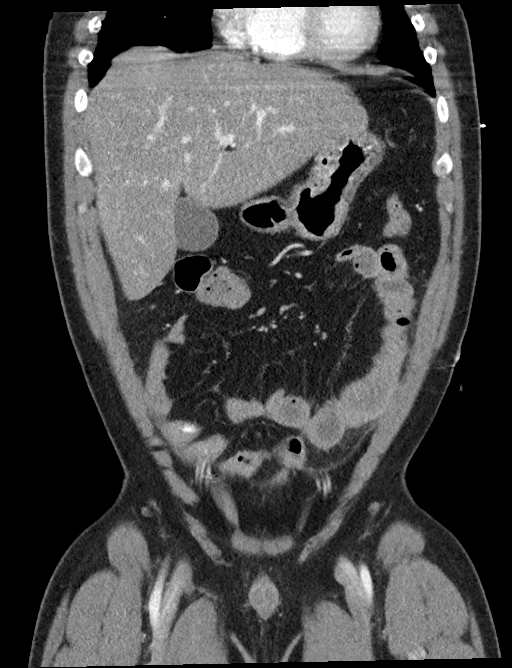
[im 43/97  soft-tissue]
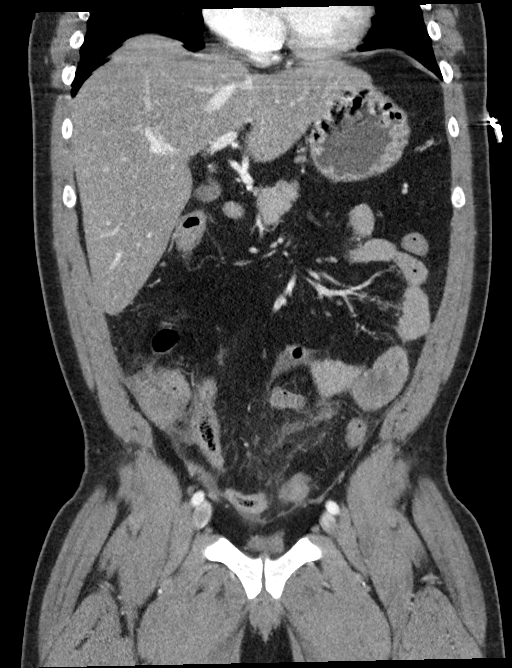
[im 54/97  soft-tissue]
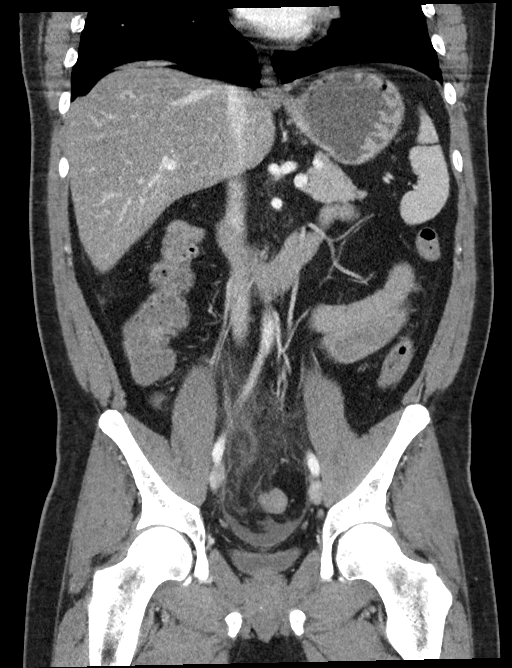

[15 of 46 positions shown; findings below may reference images not displayed]

FINDINGS: Lower chest: Mild respiratory motion, otherwise negative.

Hepatobiliary: Hepatic steatosis. Negative gallbladder. No bile duct
enlargement.

Pancreas: Negative.

Spleen: Negative.

Adrenals/Urinary Tract: Normal adrenal glands. Normal bilateral
renal enhancement and contrast excretion. Retroperitoneal stranding
along the course of the ureters at the pelvic inlet, more so the
right. Diminutive and unremarkable urinary bladder.

Stomach/Bowel: Decompressed rectosigmoid colon with regional fluid
and inflammatory stranding secondary to the appendix. Similar
decompressed distal transverse and descending colon. Gas and liquid
stool in the proximal transverse and the right colon.

Dilated and inflamed appendix courses into the midline and across
into the left abdomen from the cecum. There is a 10 millimeter round
appendicoliths at the origin (series 2 images 56 and 58 and coronal
images 49 through 46). Confluent regional inflammatory stranding. No
extraluminal gas. Regional free fluid including tracking into the
distal small bowel mesentery on series 2, image 52. Secondary
inflammation of the terminal ileum and other regional small bowel
loops. Regional reactive appearing lymphadenopathy in the mesentery.

Appendix: Location: Tracks across midline to the left posterior to
small bowel in the upper abdomen.

Diameter: 15 millimeters

Appendicolith: Positive, 10 millimeters

Mucosal hyper-enhancement: Positive

Extraluminal gas: Negative

Periappendiceal collection: Phlegmon and free fluid, no organized
collection.

There are mildly dilated fluid-filled small bowel loops in the left
abdomen. These have a gradual transition. Largely decompressed and
negative stomach. No free air identified.

Vascular/Lymphatic: Major arterial structures are patent and normal.
Portal venous system is patent.

No other abnormal lymph nodes

Reproductive: Negative.

Other: Small to moderate volume of pelvic free fluid with simple
fluid density (series 2, image 74).

Musculoskeletal: Negative.
IMPRESSION: 1. Positive for Acute Appendicitis.
Severely inflamed appendix with 10 mm appendicolith.
Confluent regional inflammation. Moderate secondary inflammation of
distal small bowel and upstream small bowel ileus.
Free fluid but no perforation suspected. No organized fluid
collection or abscess.
2. Hepatic steatosis.

## 2021-03-04 IMAGING — DX ABDOMEN - 1 VIEW
1 series · 1 of 1 positions shown · non-contrast
Comparison: None.

CLINICAL DATA: NG tube placement

EXAM:
ABDOMEN - 1 VIEW

[abdomen supine]
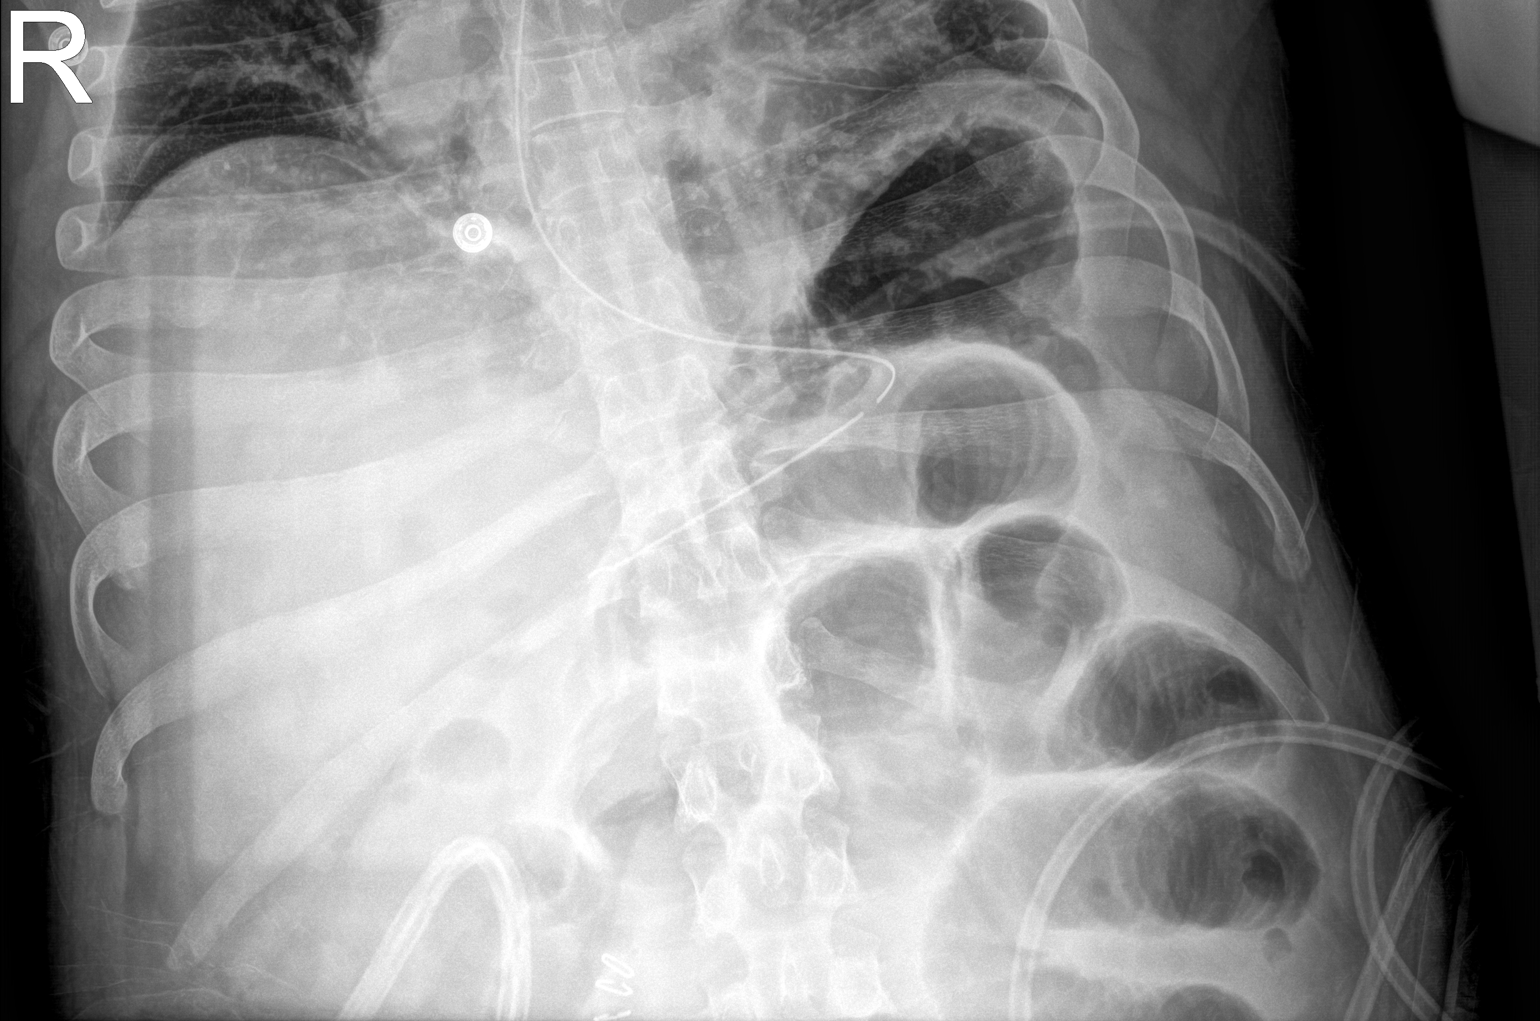

[1 of 1 positions shown; findings below may reference images not displayed]

FINDINGS: NG tube is in the stomach. Dilated small bowel loops in the
visualized left abdomen. No free air.
IMPRESSION: NG tube tip in the stomach. Dilated left abdominal small bowel
loops.

## 2021-03-08 IMAGING — CT CT ABDOMEN AND PELVIS WITH CONTRAST
2 of 5 series · 15 of 46 positions shown, 17 images · IV contrast (omnipaque)
Comparison: 06/19/2018

CLINICAL DATA: Status post appendectomy with abdominal distention
and pain. Evaluate for abscess.

EXAM:
CT ABDOMEN AND PELVIS WITH CONTRAST
TECHNIQUE: Multidetector CT imaging of the abdomen and pelvis was performed
using the standard protocol following bolus administration of
intravenous contrast.
CONTRAST:  100mL OMNIPAQUE IOHEXOL 300 MG/ML  SOLN

[Series 2: routine abd/pel with · axial · 0.78mm/px · z∈[-803,-388]mm · 12 of 97 slices shown, 14 images]
[im 7/97  soft-tissue]
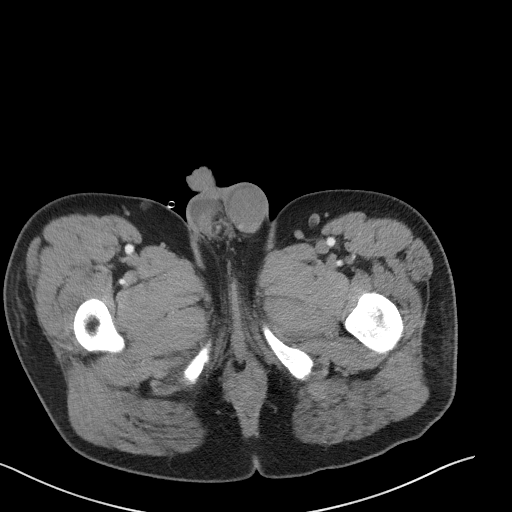
[im 7/97  bone]
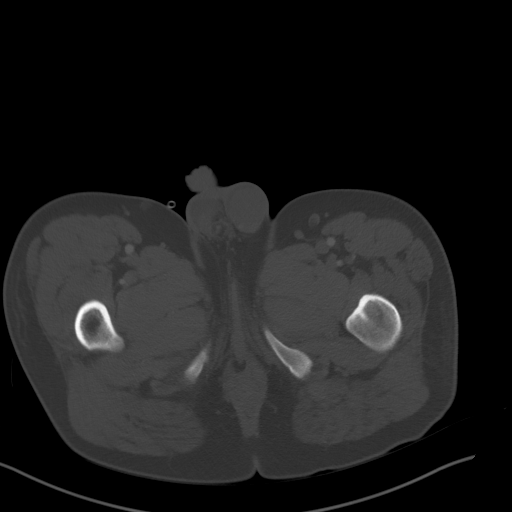
[im 14/97  soft-tissue]
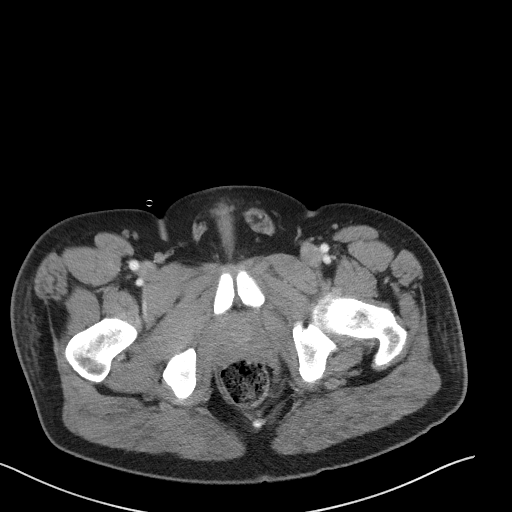
[im 21/97  soft-tissue]
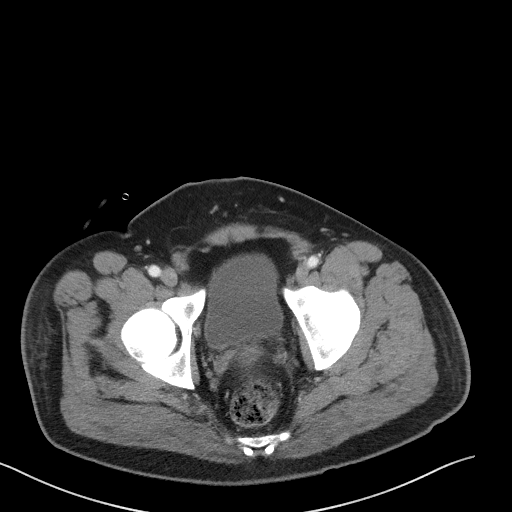
[im 28/97  soft-tissue]
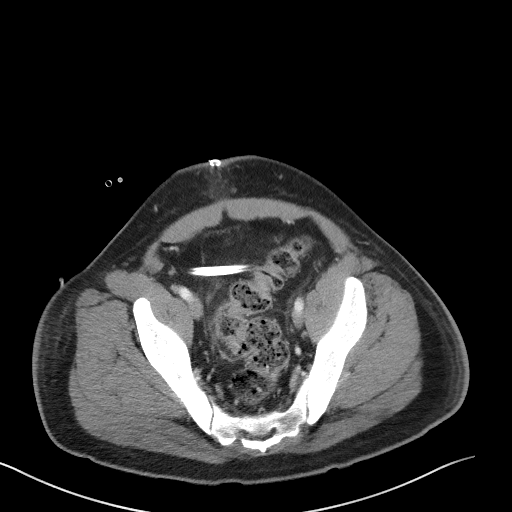
[im 35/97  soft-tissue]
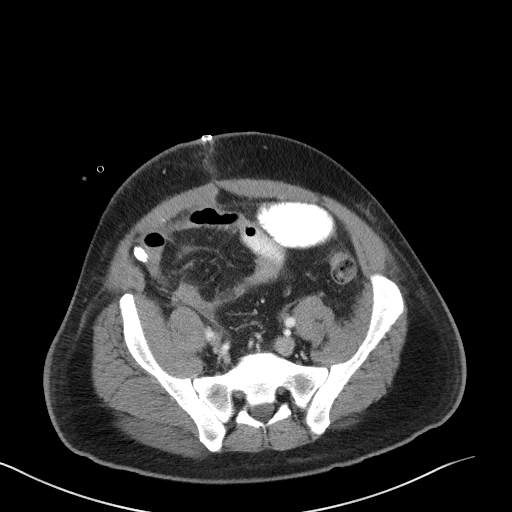
[im 42/97  soft-tissue]
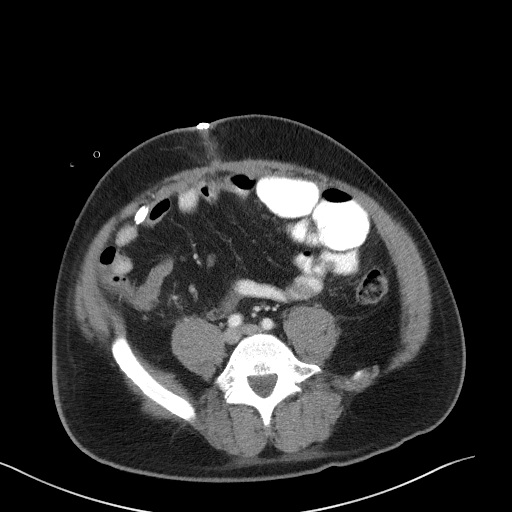
[im 55/97  soft-tissue]
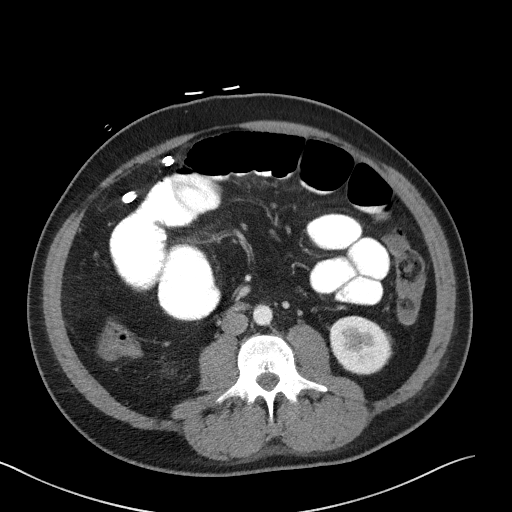
[im 62/97  soft-tissue]
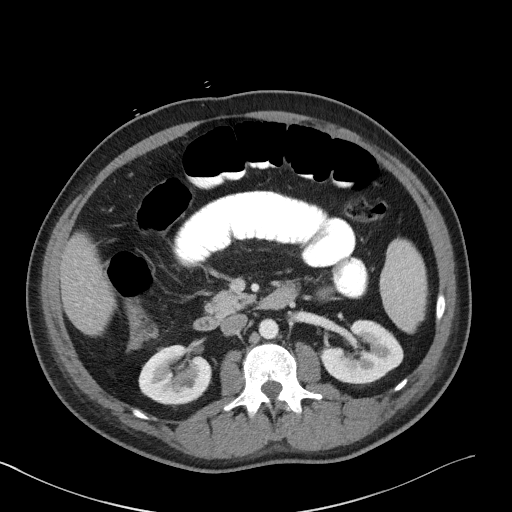
[im 69/97  soft-tissue]
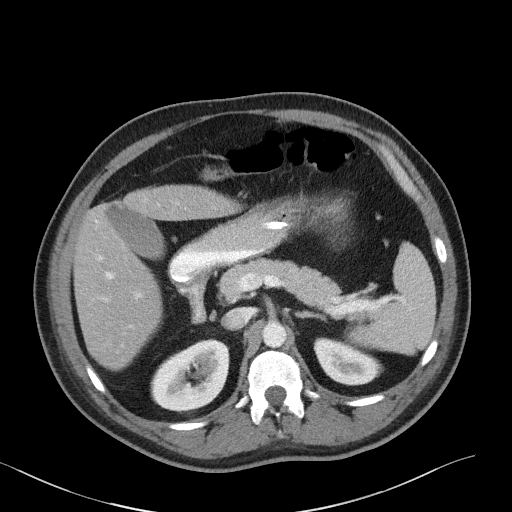
[im 69/97  bone]
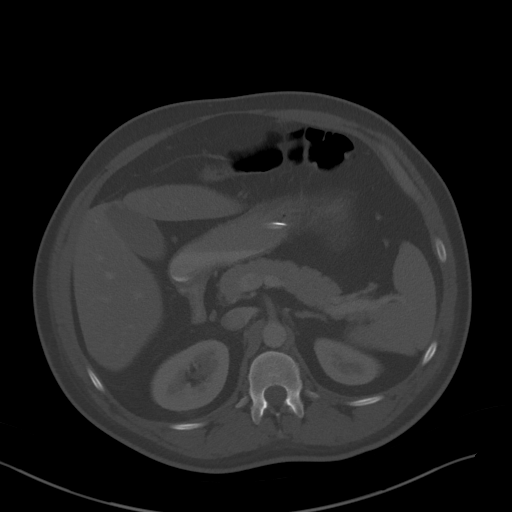
[im 76/97  soft-tissue]
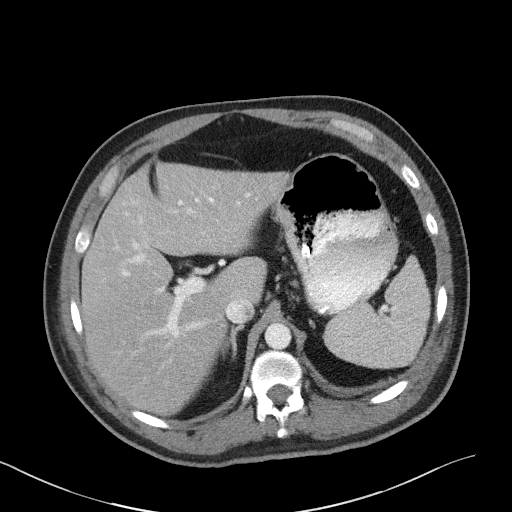
[im 83/97  soft-tissue]
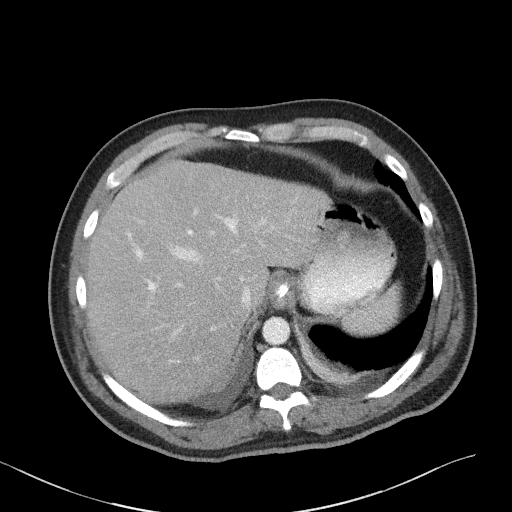
[im 90/97  soft-tissue]
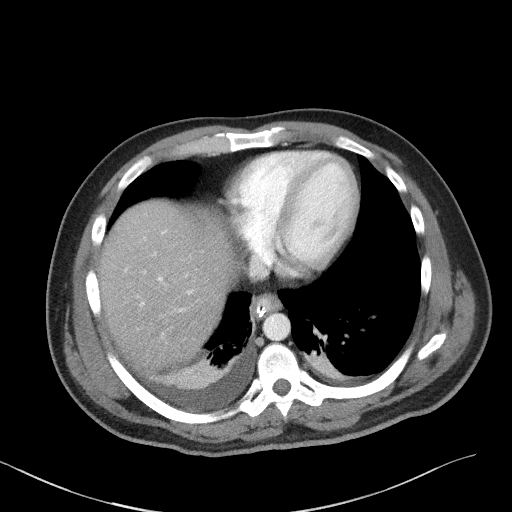

[Series 5: coronal st · coronal · 0.74mm/px · 3 of 102 slices shown]
[im 34/102  soft-tissue]
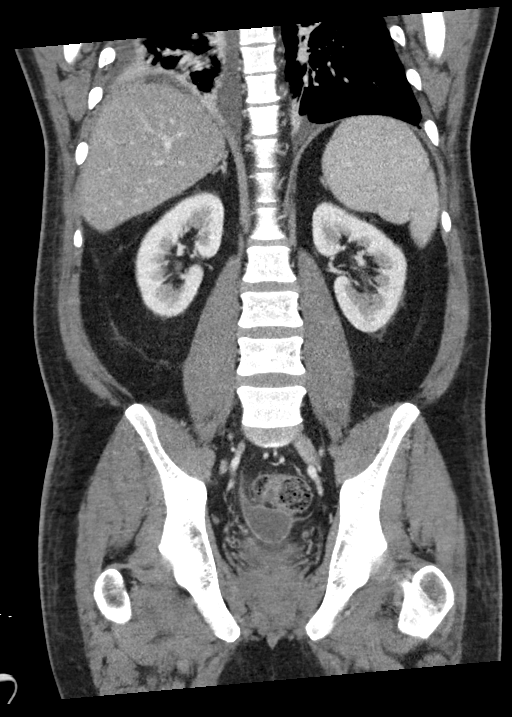
[im 45/102  soft-tissue]
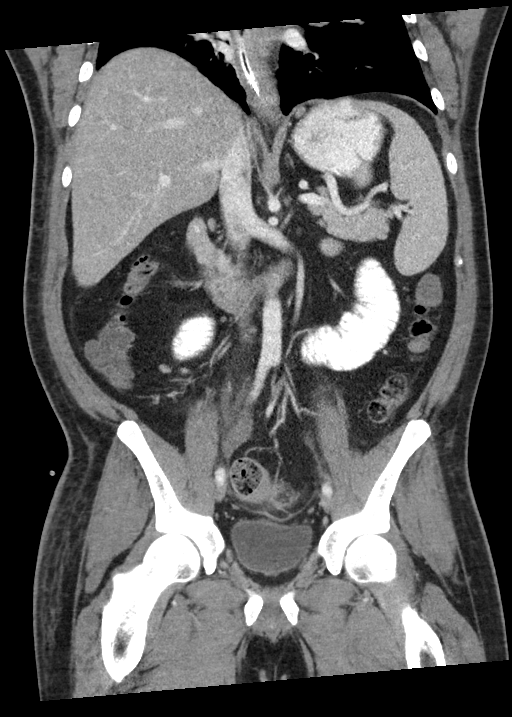
[im 57/102  soft-tissue]
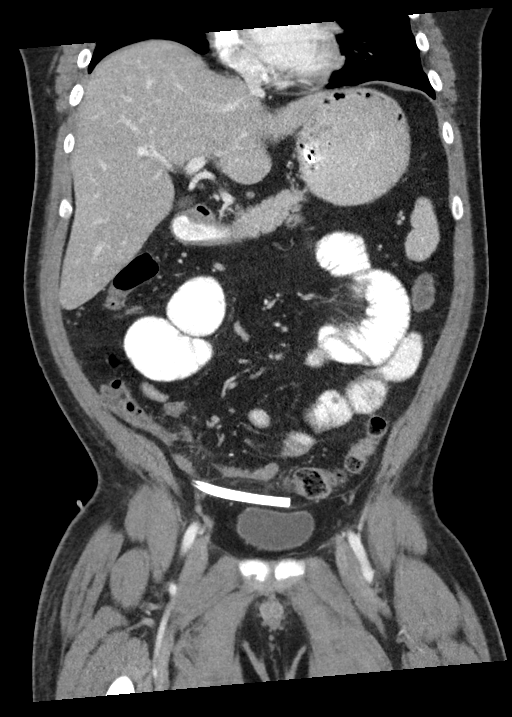

[15 of 46 positions shown; findings below may reference images not displayed]

FINDINGS: Lower chest: Small bilateral pleural effusions associated with
bibasilar atelectasis.

Hepatobiliary: No suspicious focal abnormality within the liver
parenchyma. There is no evidence for gallstones, gallbladder wall
thickening, or pericholecystic fluid. No intrahepatic or
extrahepatic biliary dilation.

Pancreas: No focal mass lesion. No dilatation of the main duct. No
intraparenchymal cyst. No peripancreatic edema.

Spleen: No splenomegaly. No focal mass lesion.

Adrenals/Urinary Tract: No adrenal nodule or mass. Kidneys
unremarkable. No evidence for hydroureter. Gas in the urinary
bladder is presumably from recent instrumentation.

Stomach/Bowel: NG tube tip is in the distal stomach. Stomach
otherwise unremarkable. Duodenum is normally positioned as is the
ligament of Treitz. Mid small bowel loops are dilated up to 4.7 cm
and opacified with oral contrast. Contrast is visible [REDACTED]ompressed bowel loops measuring only 8 mm in diameter. Distal
ileal loops are decompressed and unopacified. 8 x 19 mm rim
enhancing collection is appendectomy bed with high attenuation
material adjacent to the cecal tip, likely related to surgery. Colon
is diffusely decompressed proximally.

Vascular/Lymphatic: No abdominal aortic aneurysm. No abdominal
aortic atherosclerotic calcification. There is no gastrohepatic or
hepatoduodenal ligament lymphadenopathy. No intraperitoneal or
retroperitoneal lymphadenopathy. No pelvic sidewall lymphadenopathy.

Reproductive: The prostate gland and seminal vesicles are
unremarkable.

Other: 3.2 x 3.2 cm rim enhancing fluid collection is identified in
the central pelvis, in the rectovesical pouch period surgical drain
enters the right abdomen and tracks to the right lower quadrant with
the tip positioned near the midline anterior pelvis.

Musculoskeletal: No worrisome lytic or sclerotic osseous
abnormality. Gas bubbles in the subcutaneous fat of the left
anterior abdominal wall presumably from injection site.
IMPRESSION: 1. Dilation of mid small bowel up to 4.7 cm diameter. Proximal and
distal small bowel loops are nondilated. Oral contrast material has
migrated into the dilated loops and has passed into more distal
nondilated small bowel although not as far as the distal
ileum/colon. No discrete transition zone evident.
2. Tiny rim enhancing fluid collection in the appendectomy bed is
associated with a 3 cm rim enhancing fluid collection in the central
pelvis, suggesting abscess.
3. Gas in the urinary bladder presumably from recent
instrumentation.

## 2021-03-09 IMAGING — DX ABDOMEN - 2 VIEW
2 series · 2 of 2 positions shown · non-contrast
Comparison: CT 06/25/2018, radiograph 06/21/2018

CLINICAL DATA: Ileus

EXAM:
ABDOMEN - 2 VIEW

[abdomen erect]
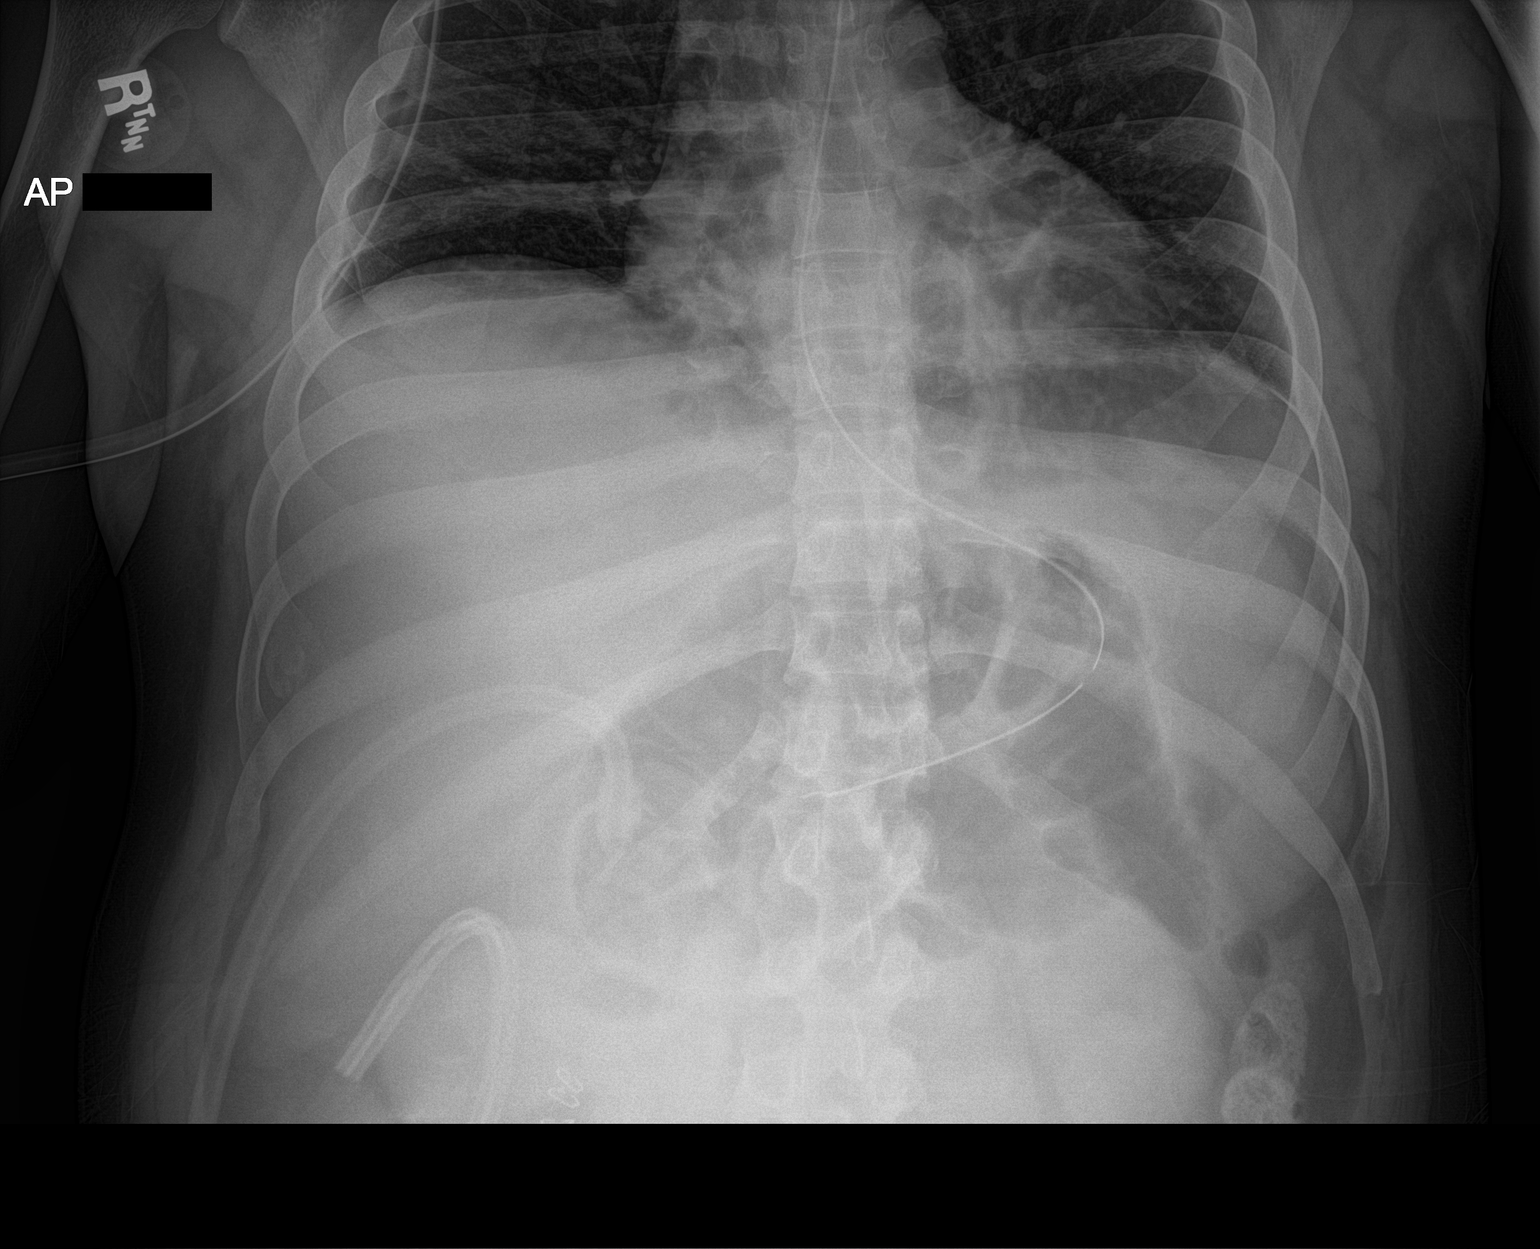

[abdomen supine]
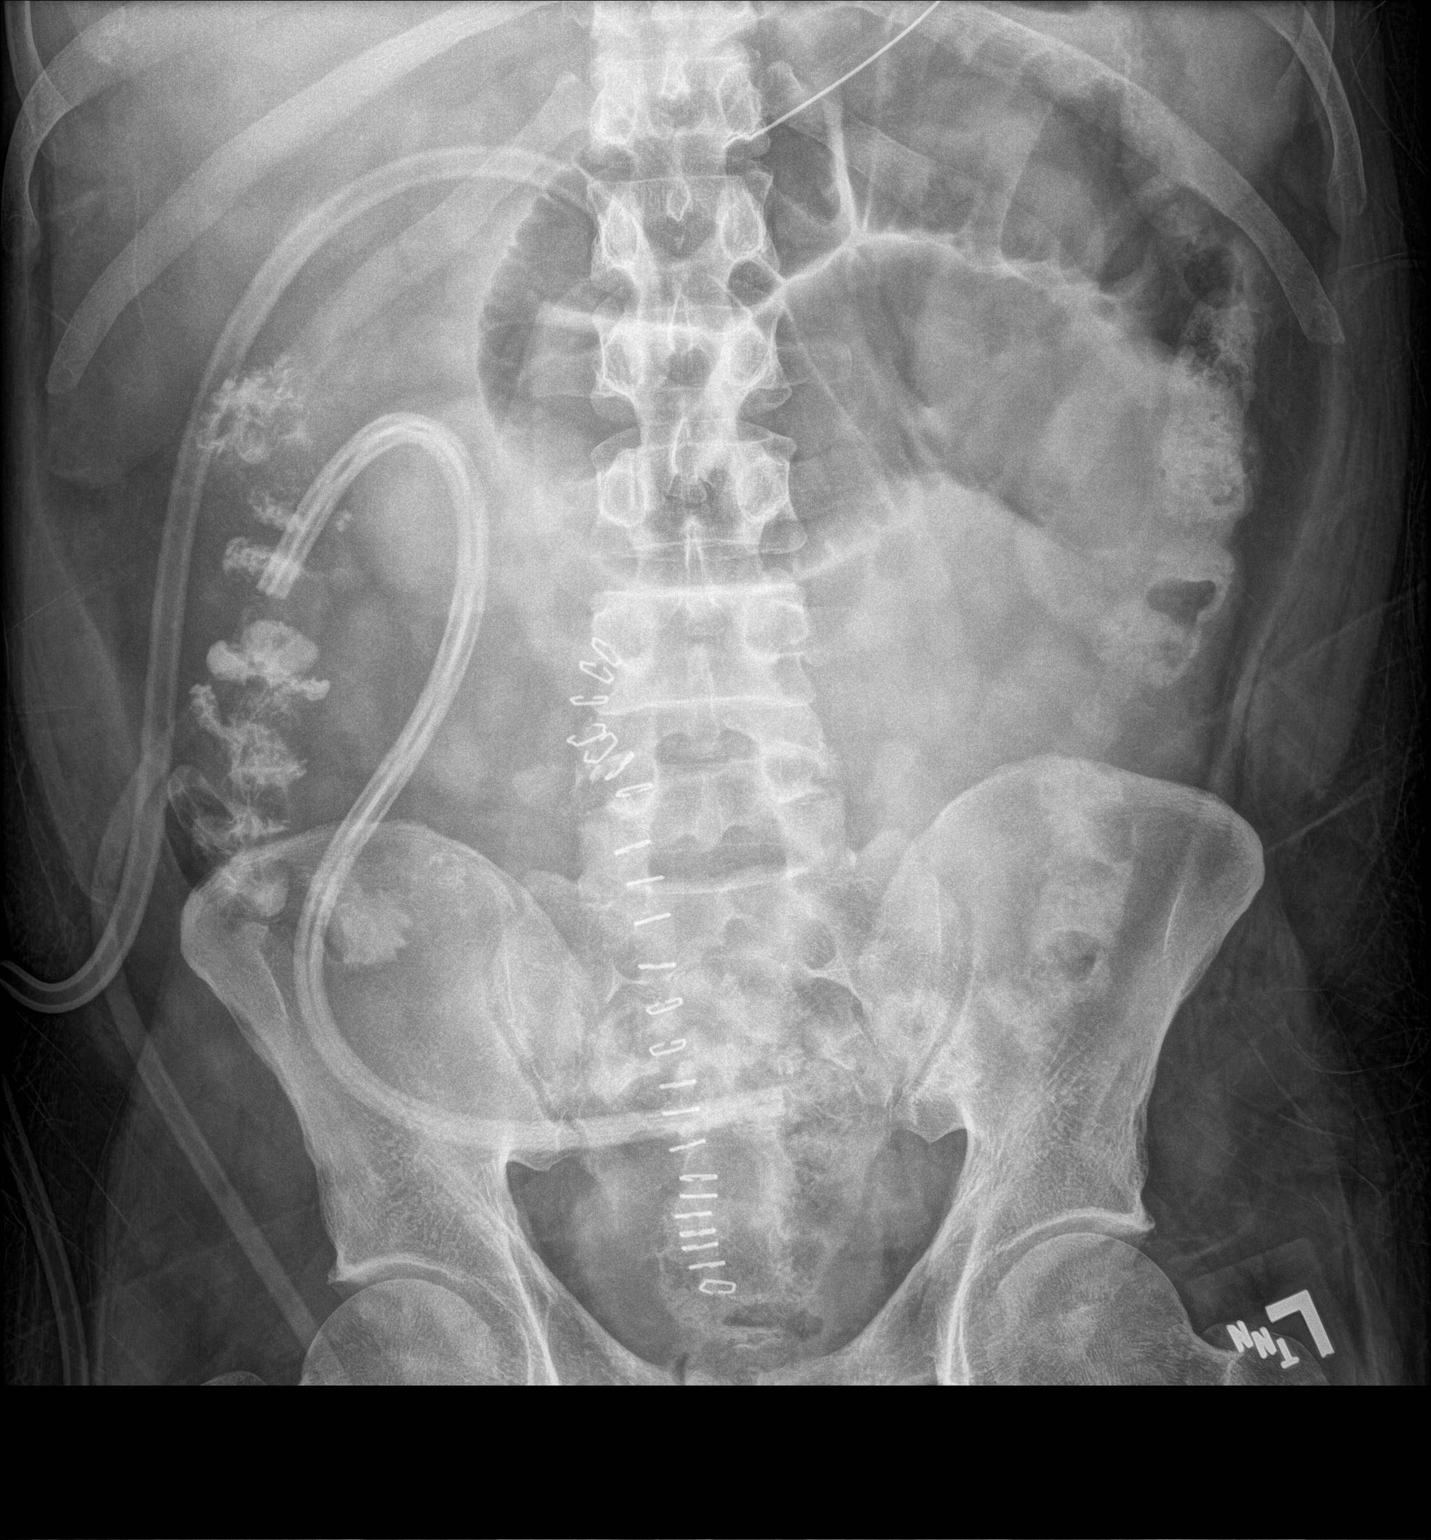

[2 of 2 positions shown; findings below may reference images not displayed]

FINDINGS: Atelectasis at the left base. Esophageal tube tip overlies the mid
stomach. No free air beneath the diaphragm. Surgical drain in the
right hemiabdomen. Persistent dilatation of small bowel in the
central abdomen measuring up to 5.6 cm, slight increased caliber of
the bowel but similar configuration as compared with scout image
from CT. Enteral contrast has migrated into the colon.
IMPRESSION: 1. Slight increased dilatation of gaseous enlarged central small
bowel now measuring up to 5.6 cm. Contrast has migrated into the
colon.

## 2021-03-31 IMAGING — CT CT IMAGE GUIDED DRAINAGE BY PERCUTANEOUS CATHETER
1 of 2 series · 13 of 32 positions shown, 19 images · non-contrast
Comparison: none

INDICATION: PELVIC ABSCESS POST APPENDECTOMY
TECHNIQUE: Informed written consent was obtained from the patient after a
thorough discussion of the procedural risks, benefits and
alternatives. All questions were addressed. Maximal Sterile Barrier
Technique was utilized including caps, mask, sterile gowns, sterile
gloves, sterile drape, hand hygiene and skin antiseptic. A timeout
was performed prior to the initiation of the procedure.

[Series 2: i-spiral 5.0 b30f · axial · 0.71mm/px · z∈[-200,-98]mm · 13 of 35 slices shown, 19 images]
[im 3/35  soft-tissue]
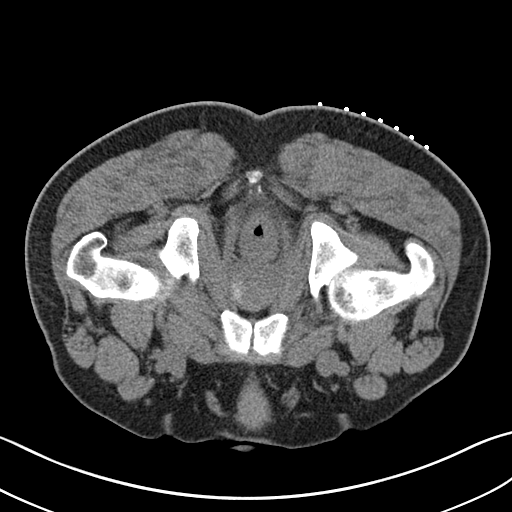
[im 3/35  bone]
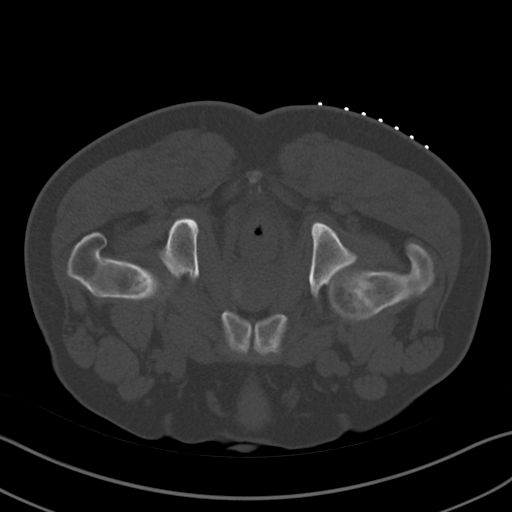
[im 5/35  soft-tissue]
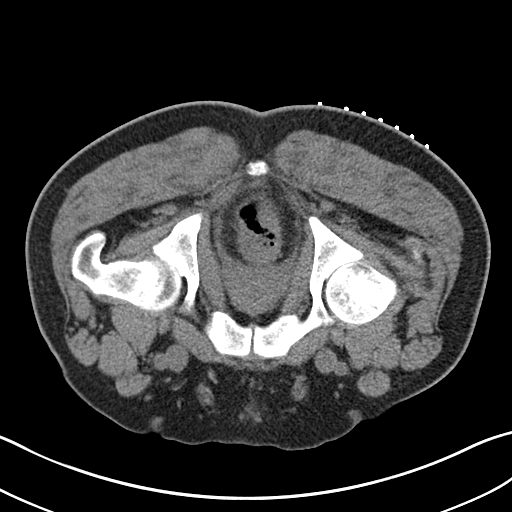
[im 7/35  soft-tissue]
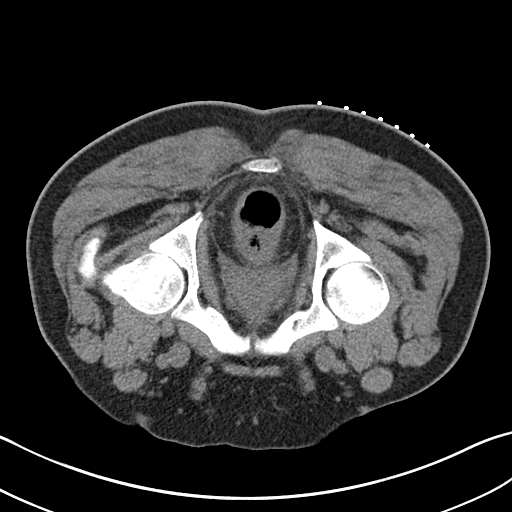
[im 10/35  soft-tissue]
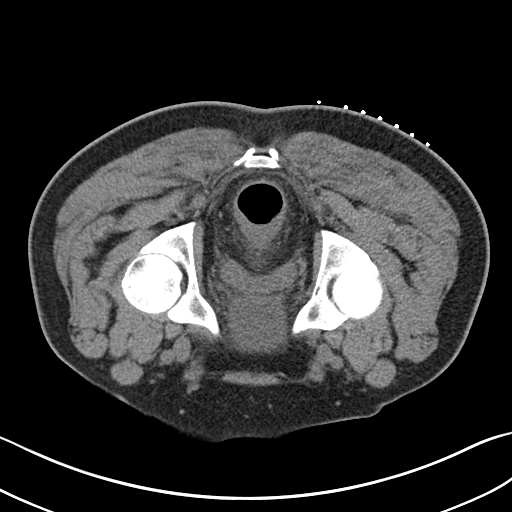
[im 12/35  soft-tissue]
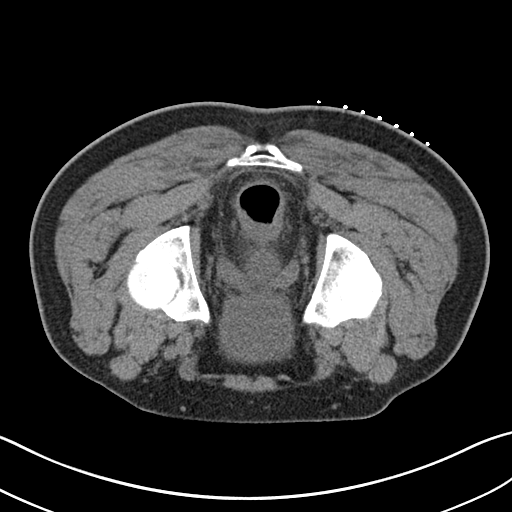
[im 14/35  soft-tissue]
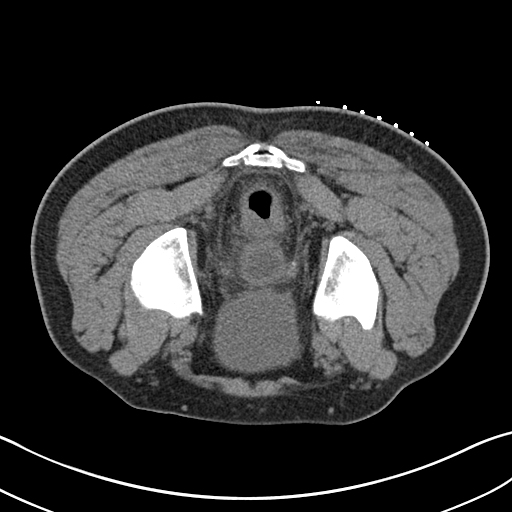
[im 19/35  soft-tissue]
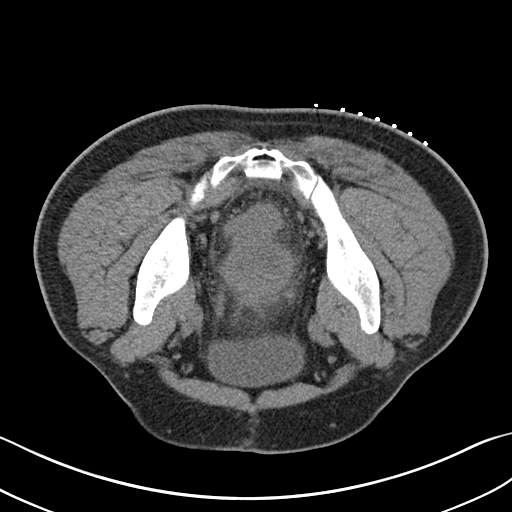
[im 21/35  soft-tissue]
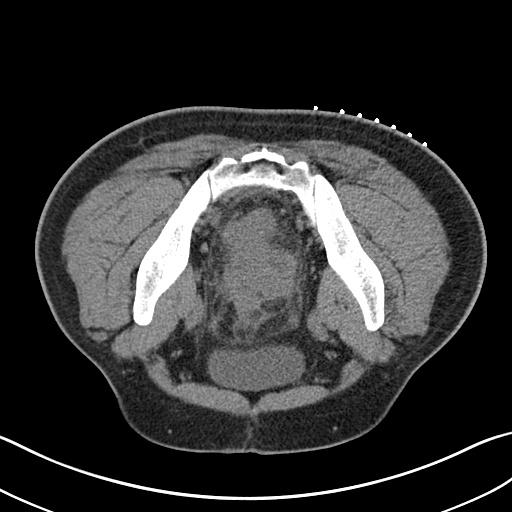
[im 23/35  soft-tissue]
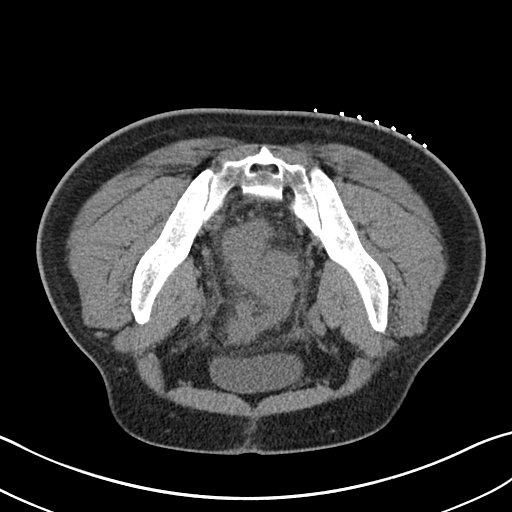
[im 23/35  bone]
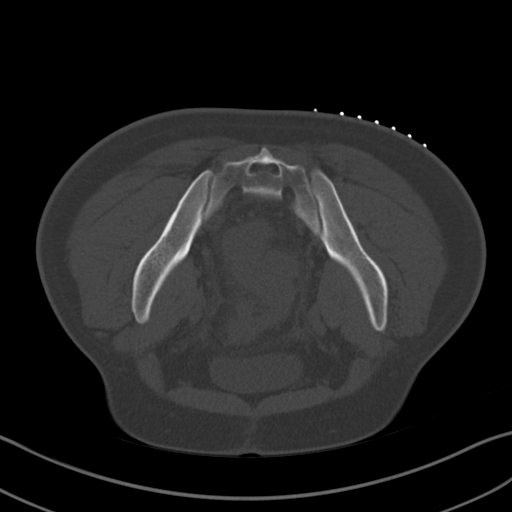
[im 25/35  soft-tissue]
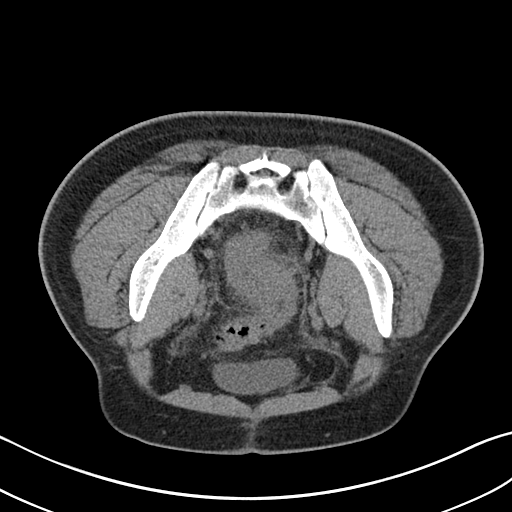
[im 25/35  lung]
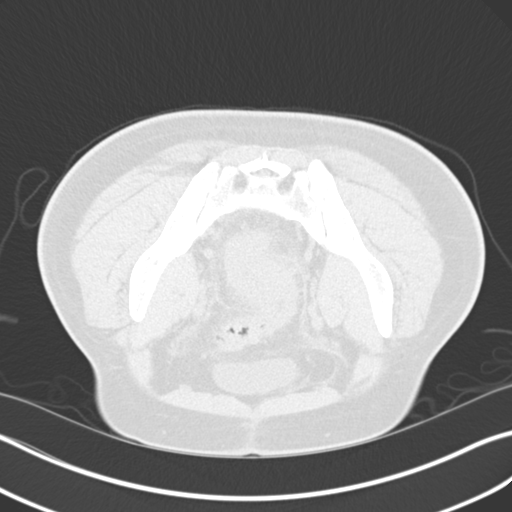
[im 28/35  soft-tissue]
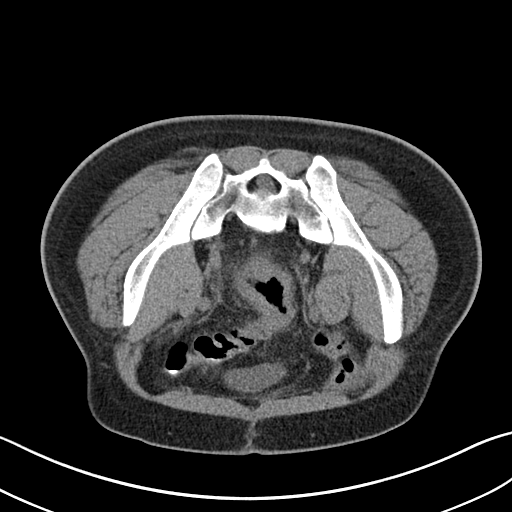
[im 28/35  lung]
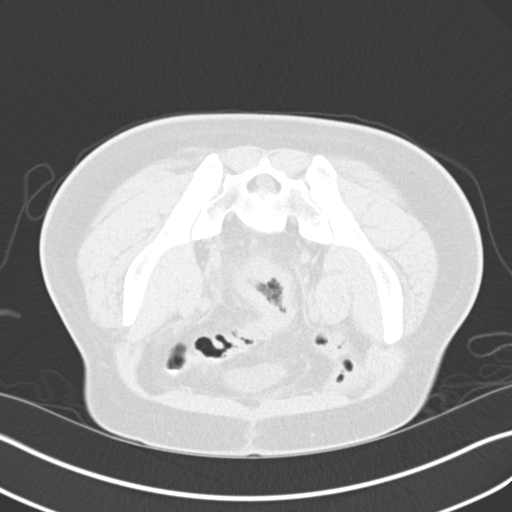
[im 30/35  soft-tissue]
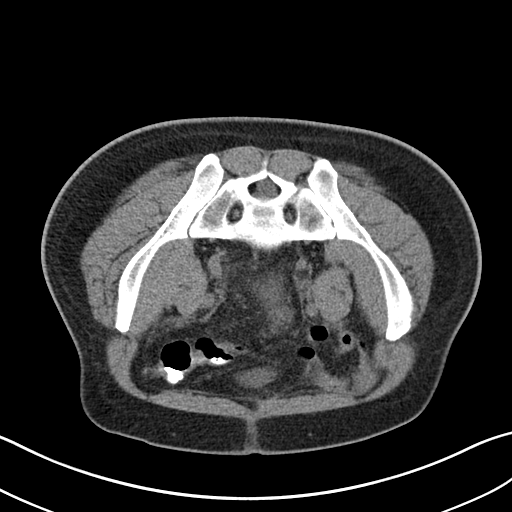
[im 30/35  lung]
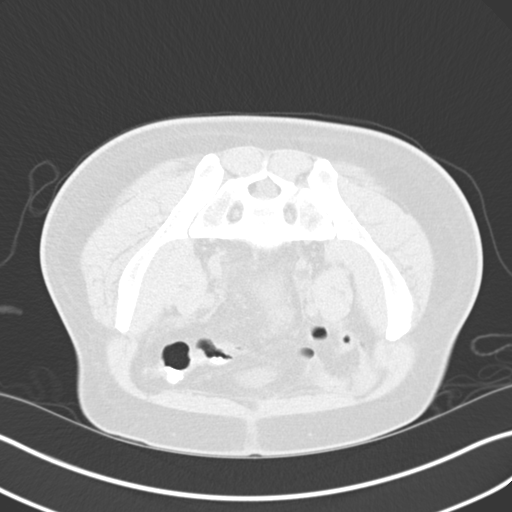
[im 32/35  soft-tissue]
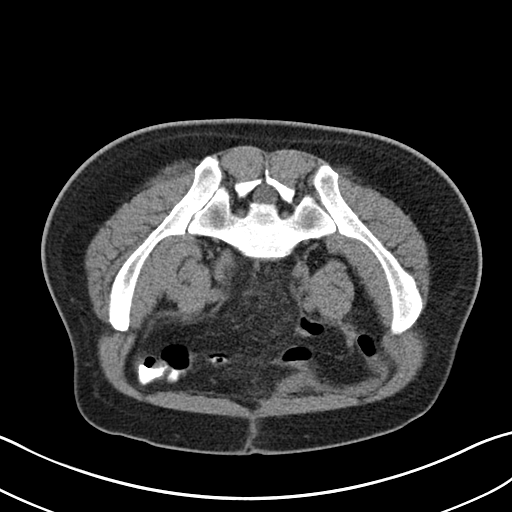
[im 32/35  lung]
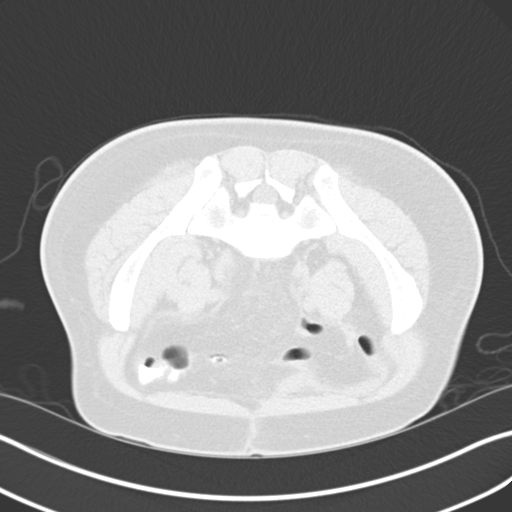

[13 of 32 positions shown; findings below may reference images not displayed]

EXAM:
CT GUIDED DRAINAGE OF PELVIC ABSCESS VIA RIGHT TRANS GLUTEAL
APPROACH

MEDICATIONS:
The patient is currently admitted to the hospital and receiving
intravenous antibiotics. The antibiotics were administered within an
appropriate time frame prior to the initiation of the procedure.

ANESTHESIA/SEDATION:
Four mg IV Versed 175 mcg IV Fentanyl

Moderate Sedation Time:  21 minutes

The patient was continuously monitored during the procedure by the
interventional radiology nurse under my direct supervision.

COMPLICATIONS:
None immediate.
PROCEDURE:
The right gluteal region was prepped with ChloraPrep in a sterile
fashion, and a sterile drape was applied covering the operative
field. A sterile gown and sterile gloves were used for the
procedure. Local anesthesia was provided with 1% Lidocaine. Under CT
guidance, an 18 gauge needle was advanced into the pelvic abscess
via right trans gluteal approach. A careful approach adjacent to the
sacrum was noted. While advancing the needle, the patient described
pain in his pelvic region but no radicular pain into the right lower
extremity. The needle was removed over an Amplatz wire. A 10 French
dilator followed by a 10 French drain was inserted and looped in the
fluid collection. 25 cc frank pus was aspirated. It was string fixed
and sewn to the skin.
FINDINGS: Images demonstrate placement of a 10 French drain into a pelvic
abscess via right trans gluteal approach.
IMPRESSION: Successful 10 French right trans gluteal pelvic abscess drainage
yielding 25 cc pus.

## 2021-05-11 ENCOUNTER — Telehealth: Payer: Self-pay

## 2021-05-11 NOTE — Telephone Encounter (Signed)
Called patient he wasnt home left voicemail for call back ?

## 2021-05-12 ENCOUNTER — Other Ambulatory Visit: Payer: Self-pay

## 2021-05-12 DIAGNOSIS — Z1211 Encounter for screening for malignant neoplasm of colon: Secondary | ICD-10-CM

## 2021-05-12 MED ORDER — PEG 3350-KCL-NA BICARB-NACL 420 G PO SOLR: 4000.0000 mL | Freq: Once | ORAL | 0 refills | Status: AC

## 2021-05-12 NOTE — Progress Notes (Signed)
Gastroenterology Pre-Procedure Review ? ?Request Date:  ?06/30/2021 ?Requesting Physician: Dr. Tobi Bastos ? ?PATIENT REVIEW QUESTIONS: The patient responded to the following health history questions as indicated:   ? ?1. Are you having any GI issues? no ?2. Do you have a personal history of Polyps? no ?3. Do you have a family history of Colon Cancer or Polyps? no ?4. Diabetes Mellitus? no ?5. Joint replacements in the past 12 months?no ?6. Major health problems in the past 3 months?no ?7. Any artificial heart valves, MVP, or defibrillator?no ?   ?MEDICATIONS & ALLERGIES:    ?Patient reports the following regarding taking any anticoagulation/antiplatelet therapy:   ?Plavix, Coumadin, Eliquis, Xarelto, Lovenox, Pradaxa, Brilinta, or Effient? no ?Aspirin? no ? ?Patient confirms/reports the following medications:  ?Current Outpatient Medications  ?Medication Sig Dispense Refill  ? amoxicillin (AMOXIL) 875 MG tablet Take 1 tablet (875 mg total) by mouth 2 (two) times daily. 20 tablet 0  ? ?No current facility-administered medications for this visit.  ? ? ?Patient confirms/reports the following allergies:  ?No Known Allergies ? ?No orders of the defined types were placed in this encounter. ? ? ?AUTHORIZATION INFORMATION ?Primary Insurance: ?1D#: ?Group #: ? ?Secondary Insurance: ?1D#: ?Group #: ? ?SCHEDULE INFORMATION: ?Date: 06/30/2021 ?Time: ?Location:[facility] ? ?

## 2021-05-29 ENCOUNTER — Telehealth: Payer: Self-pay | Admitting: Gastroenterology

## 2021-05-29 NOTE — Telephone Encounter (Signed)
Returned pt's call regarding question about his upcoming colonoscopy. Pt wanted to make sure we have it coded as a screening as his insurance will not cover a diagnostic.  ?

## 2021-05-29 NOTE — Telephone Encounter (Signed)
Patient requesting a call, has questions about upcoming procedure. ?

## 2021-06-29 ENCOUNTER — Encounter: Payer: Self-pay | Admitting: Gastroenterology

## 2021-06-30 ENCOUNTER — Encounter: Payer: Self-pay | Admitting: Gastroenterology

## 2021-06-30 ENCOUNTER — Ambulatory Visit: Payer: BC Managed Care – PPO | Admitting: Anesthesiology

## 2021-06-30 ENCOUNTER — Encounter
Admission: RE | Disposition: A | Discharge status: Home / Self Care | Payer: Self-pay | Source: Home / Self Care | Attending: Gastroenterology

## 2021-06-30 ENCOUNTER — Other Ambulatory Visit: Payer: Self-pay

## 2021-06-30 ENCOUNTER — Ambulatory Visit
Admission: RE | Admit: 2021-06-30 | Discharge: 2021-06-30 | Disposition: A | Discharge status: Home / Self Care | Payer: BC Managed Care – PPO | Attending: Gastroenterology | Admitting: Gastroenterology

## 2021-06-30 DIAGNOSIS — Z1211 Encounter for screening for malignant neoplasm of colon: Secondary | ICD-10-CM | POA: Insufficient documentation

## 2021-06-30 HISTORY — PX: COLONOSCOPY WITH PROPOFOL: SHX5780

## 2021-06-30 SURGERY — COLONOSCOPY WITH PROPOFOL
Anesthesia: General

## 2021-06-30 MED ORDER — PROPOFOL 500 MG/50ML IV EMUL
INTRAVENOUS | Status: DC | PRN
  Administered 2021-06-30: 150 ug/kg/min via INTRAVENOUS

## 2021-06-30 MED ORDER — PROPOFOL 500 MG/50ML IV EMUL
INTRAVENOUS | Status: AC
  Filled 2021-06-30: qty 50

## 2021-06-30 MED ORDER — LIDOCAINE HCL (CARDIAC) PF 100 MG/5ML IV SOSY
PREFILLED_SYRINGE | INTRAVENOUS | Status: DC | PRN
  Administered 2021-06-30: 40 mg via INTRAVENOUS

## 2021-06-30 MED ORDER — PROPOFOL 10 MG/ML IV BOLUS
INTRAVENOUS | Status: DC | PRN
  Administered 2021-06-30: 80 mg via INTRAVENOUS

## 2021-06-30 MED ORDER — SODIUM CHLORIDE 0.9 % IV SOLN: INTRAVENOUS | Status: DC

## 2021-06-30 NOTE — Transfer of Care (Signed)
Immediate Anesthesia Transfer of Care Note ? ?Patient: Matthew Li ? ?Procedure(s) Performed: Procedure(s): ?COLONOSCOPY WITH PROPOFOL (N/A) ? ?Patient Location: PACU and Endoscopy Unit ? ?Anesthesia Type:General ? ?Level of Consciousness: sedated ? ?Airway & Oxygen Therapy: Patient Spontanous Breathing and Patient connected to nasal cannula oxygen ? ?Post-op Assessment: Report given to RN and Post -op Vital signs reviewed and stable ? ?Post vital signs: Reviewed and stable ? ?Last Vitals:  ?Vitals:  ? 06/30/21 0909  ?BP: 122/78  ?Pulse: 74  ?Resp: 20  ?Temp: (!) 36 ?C  ?SpO2: 100%  ? ? ?Complications: No apparent anesthesia complications ?

## 2021-06-30 NOTE — H&P (Signed)
? ? ? ?  Jonathon Bellows, MD ?[address], [place], [place], [place], [address] ?[address], [place], [place], [place], [address] ?Phone: [phone]  ?Fax: [phone] ? ?Primary Care Physician:  Theotis Burrow, MD ? ? ?Pre-Procedure History & Physical: ?HPI:  Matthew Li is a 46 y.o. male is here for an colonoscopy. ?  ?History reviewed. No pertinent past medical history. ? ?Past Surgical History:  ?Procedure Laterality Date  ? APPENDECTOMY N/A 06/20/2018  ? Procedure: APPENDECTOMY;  Surgeon: Benjamine Sprague, DO;  Location: [facility] ORS;  Service: General;  Laterality: N/A;  ? LAPAROSCOPIC APPENDECTOMY N/A 06/20/2018  ? Procedure: APPENDECTOMY LAPAROSCOPIC;  Surgeon: Benjamine Sprague, DO;  Location: [facility] ORS;  Service: General;  Laterality: N/A;  ? ? ?Prior to Admission medications   ?Medication Sig Start Date End Date Taking? Authorizing Provider  ?amoxicillin (AMOXIL) 875 MG tablet Take 1 tablet (875 mg total) by mouth 2 (two) times daily. ?Patient not taking: Reported on 06/30/2021 10/26/18   Versie Starks, PA-C  ? ? ?Allergies as of 05/12/2021  ? (No Known Allergies)  ? ? ?History reviewed. No pertinent family history. ? ?Social History  ? ?Socioeconomic History  ? Marital status: Single  ?  Spouse name: Not on file  ? Number of children: Not on file  ? Years of education: Not on file  ? Highest education level: Not on file  ?Occupational History  ? Not on file  ?Tobacco Use  ? Smoking status: Never  ? Smokeless tobacco: Never  ?Vaping Use  ? Vaping Use: Never used  ?Substance and Sexual Activity  ? Alcohol use: Not Currently  ? Drug use: Never  ? Sexual activity: Not on file  ?Other Topics Concern  ? Not on file  ?Social History Narrative  ? Not on file  ? ?Social Determinants of Health  ? ?Financial Resource Strain: Not on file  ?Food Insecurity: Not on file  ?Transportation Needs: Not on file  ?Physical Activity: Not on file  ?Stress: Not on file  ?Social Connections: Not on file  ?Intimate Partner  Violence: Not on file  ? ? ?Review of Systems: ?See HPI, otherwise negative ROS ? ?Physical Exam: ?BP 122/78   Pulse 74   Temp (!) 96.8 ?F (36 ?C) (Temporal)   Resp 20   Ht 5\' 4"  (1.626 m)   Wt 68 kg   SpO2 100%   BMI 25.75 kg/m?  ?General:   Alert,  pleasant and cooperative in NAD ?Head:  Normocephalic and atraumatic. ?Neck:  Supple; no masses or thyromegaly. ?Lungs:  Clear throughout to auscultation, normal respiratory effort.    ?Heart:  +S1, +S2, Regular rate and rhythm, No edema. ?Abdomen:  Soft, nontender and nondistended. Normal bowel sounds, without guarding, and without rebound.   ?Neurologic:  Alert and  oriented x4;  grossly normal neurologically. ? ?Impression/Plan: ?Matthew Li is here for an colonoscopy to be performed for Screening colonoscopy average risk   ?Risks, benefits, limitations, and alternatives regarding  colonoscopy have been reviewed with the patient.  Questions have been answered.  All parties agreeable. ? ? ?Jonathon Bellows, MD  06/30/2021, 9:52 AM ? ?

## 2021-06-30 NOTE — Op Note (Signed)
[facility] ?Gastroenterology ?Patient Name: Matthew Li ?Procedure Date: 06/30/2021 9:56 AM ?MRN: [id number] ?Account #: [id number] ?Date of Birth: [birth year] ?Admit Type: Outpatient ?Age: 46 ?Room: [facility] ENDO ROOM 4 ?Gender: Male ?Note Status: Finalized ?Instrument Name: Colonscope [id number] ?Procedure:             Colonoscopy ?Indications:           Screening for colorectal malignant neoplasm ?Providers:             Jonathon Bellows MD, MD ?Referring MD:          Theotis Burrow (Referring MD) ?Medicines:             Monitored Anesthesia Care ?Complications:         No immediate complications. ?Procedure:             Pre-Anesthesia Assessment: ?                       - Prior to the procedure, a History and Physical was  ?                       performed, and patient medications, allergies and  ?                       sensitivities were reviewed. The patient's tolerance  ?                       of previous anesthesia was reviewed. ?                       - The risks and benefits of the procedure and the  ?                       sedation options and risks were discussed with the  ?                       patient. All questions were answered and informed  ?                       consent was obtained. ?                       - ASA Grade Assessment: II - A patient with mild  ?                       systemic disease. ?                       After obtaining informed consent, the colonoscope was  ?                       passed under direct vision. Throughout the procedure,  ?                       the patient's blood pressure, pulse, and oxygen  ?                       saturations were monitored continuously. The  ?                       Colonoscope was introduced  through the anus and  ?                       advanced to the the cecum, identified by the  ?                       appendiceal orifice. The colonoscopy was performed  ?                       with ease. The patient tolerated the procedure well.  ?                        The quality of the bowel preparation was excellent. ?Findings: ?     The perianal and digital rectal examinations were normal. ?     The entire examined colon appeared normal on direct and retroflexion  ?     views. ?Impression:            - The entire examined colon is normal on direct and  ?                       retroflexion views. ?                       - No specimens collected. ?Recommendation:        - Discharge patient to home (with escort). ?                       - Resume previous diet. ?                       - Continue present medications. ?                       - Repeat colonoscopy in 10 years for screening  ?                       purposes. ?Procedure Code(s):     --- Professional --- ?                       [phone], Colonoscopy, flexible; diagnostic, including  ?                       collection of specimen(s) by brushing or washing, when  ?                       performed (separate procedure) ?Diagnosis Code(s):     --- Professional --- ?                       Z12.11, Encounter for screening for malignant neoplasm  ?                       of colon ?CPT copyright 2019 [employer]. All rights reserved. ?The codes documented in this report are preliminary and upon coder review may  ?be revised to meet current compliance requirements. ?Jonathon Bellows, MD ?Jonathon Bellows MD, MD ?06/30/2021 10:16:21 AM ?This report has been signed electronically. ?Number of Addenda: 0 ?Note Initiated On: 06/30/2021 9:56 AM ?Scope Withdrawal Time: 0 hours 7 minutes 24 seconds  ?Total Procedure Duration: 0 hours 8  minutes 48 seconds  ?Estimated Blood Loss:  Estimated blood loss: none. ?     [facility] ?

## 2021-06-30 NOTE — Anesthesia Preprocedure Evaluation (Signed)
Anesthesia Evaluation  Patient identified by MRN, date of birth, ID band Patient awake    Reviewed: Allergy & Precautions, NPO status , Patient's Chart, lab work & pertinent test results  History of Anesthesia Complications Negative for: history of anesthetic complications  Airway Mallampati: III   Neck ROM: Full    Dental no notable dental hx.    Pulmonary neg pulmonary ROS,    Pulmonary exam normal breath sounds clear to auscultation       Cardiovascular Exercise Tolerance: Good negative cardio ROS Normal cardiovascular exam Rhythm:Regular Rate:Normal     Neuro/Psych negative neurological ROS     GI/Hepatic negative GI ROS,   Endo/Other  negative endocrine ROS  Renal/GU negative Renal ROS     Musculoskeletal   Abdominal   Peds  Hematology negative hematology ROS (+)   Anesthesia Other Findings   Reproductive/Obstetrics                            Anesthesia Physical Anesthesia Plan  ASA: 1  Anesthesia Plan: General   Post-op Pain Management:    Induction: Intravenous  PONV Risk Score and Plan: 2 and Propofol infusion, TIVA and Treatment may vary due to age or medical condition  Airway Management Planned: Natural Airway  Additional Equipment:   Intra-op Plan:   Post-operative Plan:   Informed Consent: I have reviewed the patients History and Physical, chart, labs and discussed the procedure including the risks, benefits and alternatives for the proposed anesthesia with the patient or authorized representative who has indicated his/her understanding and acceptance.       Plan Discussed with: CRNA  Anesthesia Plan Comments: (LMA/GETA backup discussed.  Patient consented for risks of anesthesia including but not limited to:  - adverse reactions to medications - damage to eyes, teeth, lips or other oral mucosa - nerve damage due to positioning  - sore throat or  hoarseness - damage to heart, brain, nerves, lungs, other parts of body or loss of life  Informed patient about role of CRNA in peri- and intra-operative care.  Patient voiced understanding.)        Anesthesia Quick Evaluation  

## 2021-06-30 NOTE — Anesthesia Postprocedure Evaluation (Signed)
Anesthesia Post Note ? ?Patient: Matthew Li ? ?Procedure(s) Performed: COLONOSCOPY WITH PROPOFOL ? ?Patient location during evaluation: PACU ?Anesthesia Type: General ?Level of consciousness: awake and alert, oriented and patient cooperative ?Pain management: pain level controlled ?Vital Signs Assessment: post-procedure vital signs reviewed and stable ?Respiratory status: spontaneous breathing, nonlabored ventilation and respiratory function stable ?Cardiovascular status: blood pressure returned to baseline and stable ?Postop Assessment: adequate PO intake ?Anesthetic complications: no ? ? ?No notable events documented. ? ? ?Last Vitals:  ?Vitals:  ? 06/30/21 0909  ?BP: 122/78  ?Pulse: 74  ?Resp: 20  ?Temp: (!) 36 ?C  ?SpO2: 100%  ?  ?Last Pain:  ?Vitals:  ? 06/30/21 1018  ?TempSrc:   ?PainSc: 0-No pain  ? ? ?  ?  ?  ?  ?  ?  ? ?Reed Breech ? ? ? ? ?
